# Patient Record
Sex: Female | Born: 1975 | Smoking: Current every day smoker
Health system: Southern US, Community
[De-identification: ages and names within clinical notes are randomized; demographics above are authoritative.]

## PROBLEM LIST (undated history)

## (undated) DIAGNOSIS — I959 Hypotension, unspecified: Secondary | ICD-10-CM

## (undated) DIAGNOSIS — M069 Rheumatoid arthritis, unspecified: Secondary | ICD-10-CM

## (undated) DIAGNOSIS — M329 Systemic lupus erythematosus, unspecified: Secondary | ICD-10-CM

## (undated) DIAGNOSIS — J45909 Unspecified asthma, uncomplicated: Secondary | ICD-10-CM

## (undated) DIAGNOSIS — IMO0002 Reserved for concepts with insufficient information to code with codable children: Secondary | ICD-10-CM

## (undated) DIAGNOSIS — I951 Orthostatic hypotension: Secondary | ICD-10-CM

## (undated) DIAGNOSIS — G90A Postural orthostatic tachycardia syndrome (POTS): Secondary | ICD-10-CM

## (undated) DIAGNOSIS — I498 Other specified cardiac arrhythmias: Secondary | ICD-10-CM

## (undated) DIAGNOSIS — R000 Tachycardia, unspecified: Secondary | ICD-10-CM

## (undated) DIAGNOSIS — M1990 Unspecified osteoarthritis, unspecified site: Secondary | ICD-10-CM

## (undated) HISTORY — DX: Unspecified asthma, uncomplicated: J45.909

## (undated) HISTORY — DX: Hypotension, unspecified: I95.9

## (undated) HISTORY — DX: Rheumatoid arthritis, unspecified: M06.9

## (undated) HISTORY — DX: Systemic lupus erythematosus, unspecified: M32.9

---
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
## 2009-10-03 NOTE — ED Provider Notes (Signed)
Patient is a 34 y.o. female presenting with ankle pain. The history is provided by the patient.   Ankle Pain   This is a new problem. The current episode started yesterday. The problem occurs constantly. The problem has been gradually worsening. The pain is present in the right knee (injured her knee yesterday while moving.  She twisted and hit it on a box spring.  She was able to use it yesteraday but today the pain is increased and some tingling in her rt great and second toes.  Some bruising medially). Pertinent negatives include no back pain.        Past Medical History   Diagnosis Date   ??? Asthma           Past Surgical History   Procedure Date   ??? Hx cholecystectomy    ??? Hx tubal ligation            No family history on file.     History   Social History   ??? Marital Status: Single     Spouse Name: N/A     Number of Children: N/A   ??? Years of Education: N/A   Occupational History   ??? Not on file.   Social History Main Topics   ??? Smoking status: Current Everyday Smoker -- 1.0 packs/day for 16 years   ??? Smokeless tobacco: Not on file   ??? Alcohol Use: Yes      "once every 2 weeks"   ??? Drug Use: No   ??? Sexually Active: Yes -- Female partner(s)   Other Topics Concern   ??? Not on file   Social History Narrative   ??? No narrative on file           ALLERGIES: Sulfa (sulfonamide antibiotics), Pcn, Morphine, Percocet and Doxycycline      Review of Systems   Constitutional: Negative.    Musculoskeletal: Positive for gait problem. Negative for myalgias, back pain and joint swelling.   Skin: Negative.    Neurological:        Tingling in her rt great and second toes   Psychiatric/Behavioral: Negative.        Filed Vitals:    10/03/09 1012   BP: 129/68   Pulse: 80   Temp: 98 ??F (36.7 ??C)   Resp: 18   Height: 5\' 8"  (1.727 m)   Weight: 205 lb (92.987 kg)   SpO2: 99%              Physical Exam   Constitutional: She appears well-developed and well-nourished.   HENT:   Head: Normocephalic.    Eyes: Conjunctivae are normal. Pupils are equal, round, and reactive to light.   Neck: Normal range of motion.   Cardiovascular: Normal rate.    Pulmonary/Chest: Effort normal and breath sounds normal.   Musculoskeletal: She exhibits tenderness.        Right knee: She exhibits no bony tenderness and no MCL laxity. tenderness found. Medial joint line and patellar tendon tenderness noted.        MDM    Procedures

## 2009-10-03 NOTE — ED Notes (Signed)
Pt states "i hurt my knee yesterday while moving" states "i twisted my right knee while moving a box" states pain and swelling no obvious deformity noted

## 2011-06-21 NOTE — ED Notes (Signed)
Patient states that she has a sore throat that hurts to swallow since yesterday, she has been running a fever and epigastric pain.  Patient denies N/V.

## 2011-06-21 NOTE — ED Notes (Signed)
Pt states she quit smoking two weeks ago.

## 2011-06-21 NOTE — ED Notes (Signed)
Pt given discharge instructions. Verbalized understanding. Opportunity given for questions. Accompanied by sel

## 2011-06-21 NOTE — ED Provider Notes (Signed)
HPI Comments: Patient presents with complaints of cough, sore throat and fever. Patient also reports epigastic abdominal pain with nausea. Patient reports history of asthma.     Patient is a 36 y.o. female presenting with sore throat, fever, and abdominal pain. The history is provided by the patient.   Sore Throat   This is a new problem. The current episode started yesterday. The problem has not changed since onset.There has been no fever. Associated symptoms include congestion and cough. Pertinent negatives include no diarrhea, no vomiting, no drooling, no ear discharge, no ear pain, no headaches, no plugged ear sensation, no shortness of breath, no stridor, no swollen glands, no trouble swallowing and no stiff neck. She has had no exposure to strep or mono. She has tried nothing for the symptoms.   Fever   This is a new problem. The current episode started yesterday. The problem occurs constantly. The problem has not changed since onset.Patient reports a subjective fever - was not measured.Associated symptoms include congestion, sore throat and cough. Pertinent negatives include no chest pain, no fussiness, no sleepiness, no diarrhea, no vomiting, no headaches, no tugging at ear, no muscle aches, no shortness of breath, no mental status change, no neck pain, no rash and no urinary symptoms. She has tried cough syrup for the symptoms.   Abdominal Pain   This is a new problem. The current episode started yesterday. The problem occurs constantly. The problem has not changed since onset.The pain is located in the epigastric region. The pain is at a severity of 5/10. The pain is mild. Associated symptoms include a fever and nausea. Pertinent negatives include no belching, no diarrhea, no flatus, no hematochezia, no melena, no vomiting, no constipation, no dysuria, no frequency, no hematuria, no headaches, no arthralgias, no myalgias, no trauma, no chest pain, no testicular pain and no back pain. Nothing worsens the  pain. The pain is relieved by nothing. Past workup includes no CT scan, no ultrasound, no surgery, no esophagogastroduodenoscopy, no UGI, no colonoscopy and no barium enema. The patient's surgical history non-contributory.       Past Medical History   Diagnosis Date   ??? Asthma         Past Surgical History   Procedure Date   ??? Hx cholecystectomy    ??? Hx tubal ligation          No family history on file.     History     Social History   ??? Marital Status: SINGLE     Spouse Name: N/A     Number of Children: N/A   ??? Years of Education: N/A     Occupational History   ??? Not on file.     Social History Main Topics   ??? Smoking status: Current Everyday Smoker -- 1.0 packs/day for 16 years   ??? Smokeless tobacco: Not on file   ??? Alcohol Use: Yes      "once every 2 weeks"   ??? Drug Use: No   ??? Sexually Active: Yes -- Female partner(s)     Other Topics Concern   ??? Not on file     Social History Narrative   ??? No narrative on file                  ALLERGIES: Doxycycline; Morphine; Pcn; Percocet; and Sulfa(sulfonamide antibiotics)      Review of Systems   Constitutional: Positive for fever.   HENT: Positive for congestion and sore throat. Negative  for ear pain, drooling, trouble swallowing, neck pain and ear discharge.    Eyes: Negative.    Respiratory: Positive for cough. Negative for shortness of breath and stridor.    Cardiovascular: Negative.  Negative for chest pain.   Gastrointestinal: Positive for nausea and abdominal pain. Negative for vomiting, diarrhea, constipation, melena, hematochezia and flatus.   Genitourinary: Negative.  Negative for dysuria, frequency, hematuria and testicular pain.   Musculoskeletal: Negative.  Negative for myalgias, back pain and arthralgias.   Skin: Negative.  Negative for rash.   Neurological: Negative.  Negative for headaches.   Hematological: Negative.    Psychiatric/Behavioral: Negative.    All other systems reviewed and are negative.        Filed Vitals:    06/21/11 2152   BP: 120/73   Pulse:  96   Temp: 99.6 ??F (37.6 ??C)   Resp: 16   Height: 5\' 6"  (1.676 m)   Weight: 90.719 kg (200 lb)   SpO2: 97%            Physical Exam   Nursing note and vitals reviewed.  Constitutional: She is oriented to person, place, and time. She appears well-developed and well-nourished.   HENT:   Head: Normocephalic and atraumatic.   Right Ear: Hearing, tympanic membrane, external ear and ear canal normal.   Left Ear: Hearing, tympanic membrane, external ear and ear canal normal.   Nose: No rhinorrhea.   Mouth/Throat: Posterior oropharyngeal erythema present. No oropharyngeal exudate, posterior oropharyngeal edema or tonsillar abscesses.   Eyes: Conjunctivae and EOM are normal. Pupils are equal, round, and reactive to light. Right eye exhibits no discharge. Left eye exhibits no discharge. No scleral icterus.   Neck: Normal range of motion. No tracheal deviation present. No thyromegaly present.   Cardiovascular: Normal rate, regular rhythm and normal heart sounds.    No murmur heard.  Pulmonary/Chest: Effort normal and breath sounds normal. No respiratory distress. She has no wheezes. She has no rales. She exhibits no tenderness.   Abdominal: Soft. Normal appearance and bowel sounds are normal. She exhibits no distension. There is no tenderness. There is no rigidity, no rebound, no guarding, no tenderness at McBurney's point and negative Murphy's sign.        Non-surgical abdomen.    Musculoskeletal: Normal range of motion. She exhibits no edema and no tenderness.   Lymphadenopathy:     She has no cervical adenopathy.   Neurological: She is alert and oriented to person, place, and time. No cranial nerve deficit. Coordination normal.   Skin: Skin is warm. No erythema.   Psychiatric: She has a normal mood and affect. Her behavior is normal. Judgment and thought content normal.        MDM    Procedures

## 2011-06-21 NOTE — ED Notes (Signed)
Pt ambulated to xray.

## 2011-06-22 LAB — URINALYSIS W/ REFLEX CULTURE
Bilirubin: NEGATIVE
Blood: NEGATIVE
Glucose: NEGATIVE MG/DL
Ketone: NEGATIVE MG/DL
Nitrites: NEGATIVE
Protein: NEGATIVE MG/DL
Specific gravity: 1.01 (ref 1.003–1.030)
Urobilinogen: 0.2 EU/DL (ref 0.2–1.0)
pH (UA): 7.5 (ref 5.0–8.0)

## 2011-06-22 MED ORDER — POLYETHYLENE GLYCOL 3350 100 % ORAL POWDER
17 gram/dose | Freq: Every day | ORAL | Status: DC
Start: 2011-06-22 — End: 2015-05-12

## 2011-06-22 MED ORDER — CIPROFLOXACIN 500 MG TAB
500 mg | ORAL_TABLET | Freq: Two times a day (BID) | ORAL | Status: AC
Start: 2011-06-22 — End: 2011-06-26

## 2011-06-23 LAB — CULTURE, URINE
Colonies Counted: <1000
Colony Count: <1000
Culture result:: NO GROWTH
Culture: NO GROWTH

## 2011-06-27 NOTE — ED Provider Notes (Signed)
I have personally seen and evaluated patient. I find the patient's history and physical exam are consistent with the PA's NP documentation. I agree with the care provided, treatments rendered, disposition and follow up plan.

## 2011-09-15 ENCOUNTER — Encounter

## 2015-04-20 ENCOUNTER — Ambulatory Visit: Admit: 2015-04-20 | Attending: Specialist | Primary: Orthopaedic Surgery

## 2015-04-20 DIAGNOSIS — R55 Syncope and collapse: Secondary | ICD-10-CM

## 2015-04-20 NOTE — Patient Instructions (Signed)
Stress Echo and 24 hour holter     Follow up with Dr.Giusti a week after tests

## 2015-04-20 NOTE — Addendum Note (Signed)
Addended by: Lavell IslamPARKS, Marien Manship P on: 04/20/2015 03:07 PM      Modules accepted: Orders

## 2015-04-20 NOTE — Progress Notes (Signed)
Chief Complaint   Patient presents with   ??? New Patient     cardiac clearance for wrist surgery.  Occasional chest discomfort.  States due to asthma and lupus.  Continuous shortness of breath.

## 2015-04-20 NOTE — Progress Notes (Signed)
HISTORY OF PRESENT ILLNESS  Amber Stewart is a 39 y.o. female. 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    Cardiovascular: Negative.    Neurological: Positive for loss of consciousness.       Physical Exam  Physical Exam   Blood pressure 118/76, pulse 81, resp. rate 16, height 5' 5.5" (1.664 m), weight 199 lb 6.4 oz (90.4 kg), SpO2 97 %.  Constitutional: She is oriented to person, place, and time. She appears well-developed and well-nourished. No distress. HENT: Head: Normocephalic. Eyes: No scleral icterus. Neck: Normal range of motion. Neck supple. No JVD present. No tracheal deviation present.   Cardiovascular: Normal rate, regular rhythm, normal heart sounds and intact distal pulses.  Exam reveals no gallop and no friction rub.  No murmur heard.  Pulmonary/Chest: Effort normal and breath sounds normal. No stridor. No respiratory distress, wheezes or  rales.   Abdominal: She exhibits no distension.   Musculoskeletal: She exhibits no edema.   Neurological: She is alert and oriented to person, place, and time. Coordination normal.   Skin: Skin is warm. No rash noted. Not diaphoretic. No erythema.   Psychiatric:  Normal mood and affect.  Behavior is normal.   No current outpatient prescriptions on file prior to visit.     No current facility-administered medications on file prior to visit.        ASSESSMENT and PLAN  Syncope: unclear etiology at this time. Her ECG shows NSR , NT and prwp anterior leads. Exaggerated vagal response  Possibility [facility]   In the meanwhile proceed with stress echo to r/o ischemia , evaluate ef and evaluate hr response to exercise  Her BP is well controlled  Tobacco cessation discussed  Obtain 24 hours holter as well  See her back after tests

## 2015-04-27 ENCOUNTER — Encounter: Primary: Orthopaedic Surgery

## 2015-04-27 ENCOUNTER — Institutional Professional Consult (permissible substitution): Admit: 2015-04-27 | Primary: Orthopaedic Surgery

## 2015-04-27 DIAGNOSIS — R55 Syncope and collapse: Secondary | ICD-10-CM

## 2015-04-27 NOTE — Progress Notes (Signed)
24hour Holter monitor # [id number] applied per Dr. Arnoldo LenisGiusti dx. Syncope patient to return monitor 04/28/15 patient made aware

## 2015-05-06 ENCOUNTER — Encounter: Admit: 2015-05-06 | Discharge: 2015-05-06 | Primary: Orthopaedic Surgery

## 2015-05-06 ENCOUNTER — Institutional Professional Consult (permissible substitution): Admit: 2015-05-06 | Discharge: 2015-05-06 | Primary: Orthopaedic Surgery

## 2015-05-06 DIAGNOSIS — R55 Syncope and collapse: Secondary | ICD-10-CM

## 2015-05-06 NOTE — Progress Notes (Signed)
Stress echo completed.

## 2015-05-12 ENCOUNTER — Ambulatory Visit: Admit: 2015-05-12 | Attending: Specialist | Primary: Orthopaedic Surgery

## 2015-05-12 DIAGNOSIS — R55 Syncope and collapse: Secondary | ICD-10-CM

## 2015-05-12 MED ORDER — FLUDROCORTISONE 0.1 MG TAB
0.1 mg | ORAL_TABLET | Freq: Every day | ORAL | 3 refills | Status: DC
Start: 2015-05-12 — End: 2015-09-04

## 2015-05-12 NOTE — Telephone Encounter (Signed)
Verbal order per Dr.Giusti

## 2015-05-12 NOTE — Patient Instructions (Signed)
Start Florinef 0.1 mg daily    [facility] to [facility] [phone])  [address]

## 2015-05-12 NOTE — Progress Notes (Signed)
HISTORY OF PRESENT ILLNESS  Amber Stewart is a 39 y.o. 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    Cardiovascular: Negative.    Neurological: Negative.        Physical Exam  Physical Exam   Blood pressure 90/60, pulse 85, resp. rate 16, height 5' 5.5" (1.664 m), weight 200 lb 3.2 oz (90.8 kg), SpO2 98 %.  Constitutional: She is oriented to person, place, and time. She appears well-developed and well-nourished. No distress. HENT: Head: Normocephalic. Eyes: No scleral icterus. Neck: Normal range of motion. Neck supple. No JVD present. No tracheal deviation present.   Cardiovascular: Normal rate, regular rhythm, normal heart sounds and intact distal pulses.  Exam reveals no gallop and no friction rub.  No murmur heard.  Pulmonary/Chest: Effort normal and breath sounds normal. No stridor. No respiratory distress, wheezes or  rales.   Abdominal: She exhibits no distension.   Musculoskeletal: She exhibits no edema.   Neurological: She is alert and oriented to person, place, and time. Coordination normal.   Skin: Skin is warm. No rash noted. Not diaphoretic. No erythema.   Psychiatric:  Normal mood and affect.  Behavior is normal.   Current Outpatient Prescriptions on File Prior to Visit   Medication Sig Dispense Refill   ??? hydroxychloroquine (PLAQUENIL) 200 mg tablet Take 200 mg by mouth daily.     ??? traMADol (ULTRAM-ER) 100 mg Tb24 Take 100 mg by mouth three (3) times daily.      ??? gabapentin (NEURONTIN) 300 mg capsule Take 300 mg by mouth three (3) times daily.     ??? diazePAM (VALIUM) 2 mg tablet Take 2 mg by mouth nightly.     ??? ibuprofen (MOTRIN) 600 mg tablet Take  by mouth every six (6) hours as needed for Pain.     ??? HYDROMORPHONE HCL (DILAUDID PO) Take 5 mg by mouth as needed.     ??? naproxen sodium (ALEVE) 220 mg tablet Take 220 mg by mouth two (2) times daily (with meals).         No current facility-administered medications on file prior to visit.        ASSESSMENT and PLAN  Syncope: not recurrent. Her stress echo and holter are essentially normal. I suspect she does have some degree of autonomic dysfunction  HUT on hold for now and she is completely agreeable to hold off hut  Still a bit symptomatic with lightheadedness , discussed options  Start florinef 0.1 mg daily. Side effects discussed patient agreable    Exaggerated vagal response also a possibility  Tobacco cessation discussed  Preop: at this time the patient is at an acceptable cardiac risk to proceed with wrist surgery as planned. Would recommend aggressive hydration preop such as 1 liter of NS prior to surgery and keeping her well hydrated  She has asked me to be followed at [facility] for insurance  issues. I will refer her to Dr. Ladona HornsLevitt for follow up of florinef rx and autonomic dysfunction

## 2015-05-28 NOTE — Telephone Encounter (Signed)
Patient called regarding cardiac clearance for upcoming wrist surgery. She would like [facility] [phone]. If you have any questions for patient she can be reached at [phone]

## 2015-05-28 NOTE — Telephone Encounter (Signed)
Left message on patient's voice mail (states name) regarding cardiac clearance.  Copy of office visit sent to Dr. Lockie Molahillon.  Encouraged her to  call back for further needs.

## 2015-06-04 NOTE — Telephone Encounter (Signed)
Called and left message with Charlene at Dr. Ladona Mowhillon's office to verify that they received office note regarding pre-op concerns.

## 2015-06-04 NOTE — Telephone Encounter (Signed)
Amber Stewart called from Dr. Ladona Mowhillon's office and stated their office received your fax but they did not receive the cardiac clearance note. Amber Stewart can be reached at [phone]

## 2015-06-04 NOTE — Telephone Encounter (Signed)
Faxed and confirmation received.

## 2015-06-04 NOTE — Telephone Encounter (Signed)
Terry with [facility] called to ask if you could fax the cardiac clearance again but to fax # [phone], phone if needed is [phone].     Thank you, Olegario MessierKathy

## 2015-06-04 NOTE — Telephone Encounter (Signed)
Copy of  05-12-15 office visit faxed to [facility] at Dr. Ladona Mowhillon's office.  Confirmation received.  Called and left message with Aurther Lofterry regarding receiving and if information was sufficient.  Encouraged her to call with further needs.

## 2015-06-05 NOTE — Telephone Encounter (Signed)
Spoke to [place] at Dr. Ladona Mowhillon's office.  She received office visit note with  pre-op information.  Dr. Ladona Mowhillon's nurse to review and call us for further needs.

## 2015-09-07 MED ORDER — FLUDROCORTISONE 0.1 MG TAB
0.1 mg | ORAL_TABLET | ORAL | 2 refills | Status: DC
Start: 2015-09-07 — End: 2016-06-17

## 2015-09-07 NOTE — Telephone Encounter (Signed)
Verbal order per Dr.Giusti

## 2016-04-10 ENCOUNTER — Emergency Department (HOSPITAL_COMMUNITY)
Admission: EM | Admit: 2016-04-10 | Discharge: 2016-04-10 | Disposition: A | Discharge status: Home / Self Care | Payer: 59 | Attending: Emergency Medicine | Admitting: Emergency Medicine

## 2016-04-10 ENCOUNTER — Encounter (HOSPITAL_COMMUNITY): Payer: Self-pay | Admitting: *Deleted

## 2016-04-10 ENCOUNTER — Emergency Department (HOSPITAL_COMMUNITY): Payer: 59

## 2016-04-10 DIAGNOSIS — R059 Cough, unspecified: Secondary | ICD-10-CM

## 2016-04-10 DIAGNOSIS — J40 Bronchitis, not specified as acute or chronic: Secondary | ICD-10-CM | POA: Insufficient documentation

## 2016-04-10 DIAGNOSIS — R0602 Shortness of breath: Secondary | ICD-10-CM | POA: Diagnosis present

## 2016-04-10 DIAGNOSIS — F17200 Nicotine dependence, unspecified, uncomplicated: Secondary | ICD-10-CM | POA: Diagnosis not present

## 2016-04-10 DIAGNOSIS — R05 Cough: Secondary | ICD-10-CM

## 2016-04-10 HISTORY — DX: Orthostatic hypotension: I95.1

## 2016-04-10 HISTORY — DX: Reserved for concepts with insufficient information to code with codable children: IMO0002

## 2016-04-10 HISTORY — DX: Postural orthostatic tachycardia syndrome (POTS): G90.A

## 2016-04-10 HISTORY — DX: Tachycardia, unspecified: R00.0

## 2016-04-10 HISTORY — DX: Unspecified asthma, uncomplicated: J45.909

## 2016-04-10 HISTORY — DX: Systemic lupus erythematosus, unspecified: M32.9

## 2016-04-10 HISTORY — DX: Unspecified osteoarthritis, unspecified site: M19.90

## 2016-04-10 HISTORY — DX: Other specified cardiac arrhythmias: I49.8

## 2016-04-10 LAB — URINALYSIS, ROUTINE W REFLEX MICROSCOPIC
BILIRUBIN URINE: NEGATIVE
GLUCOSE, UA: NEGATIVE mg/dL
KETONES UR: NEGATIVE mg/dL
Nitrite: NEGATIVE
PH: 7 (ref 5.0–8.0)
Protein, ur: NEGATIVE mg/dL
SPECIFIC GRAVITY, URINE: 1.01 (ref 1.005–1.030)

## 2016-04-10 LAB — URINE MICROSCOPIC-ADD ON

## 2016-04-10 LAB — PREGNANCY, URINE: PREG TEST UR: NEGATIVE

## 2016-04-10 MED ORDER — PREDNISONE 10 MG PO TABS: 40.0000 mg | ORAL_TABLET | Freq: Every day | ORAL | 0 refills | Status: AC

## 2016-04-10 MED ORDER — ALBUTEROL SULFATE HFA 108 (90 BASE) MCG/ACT IN AERS
2.0000 | INHALATION_SPRAY | Freq: Once | RESPIRATORY_TRACT | Status: AC
  Administered 2016-04-10: 2 via RESPIRATORY_TRACT
  Filled 2016-04-10: qty 6.7

## 2016-04-10 MED ORDER — PREDNISONE 20 MG PO TABS
60.0000 mg | ORAL_TABLET | Freq: Once | ORAL | Status: AC
Start: 2016-04-10 — End: 2016-04-10
  Administered 2016-04-10: 60 mg via ORAL
  Filled 2016-04-10: qty 3

## 2016-04-10 NOTE — ED Provider Notes (Signed)
[facility] DEPT Provider Note   CSN: [id number] Arrival date & time: 04/10/16  1443     History   Chief Complaint Chief Complaint  Patient presents with  . Cough  . Shortness of Breath    HPI Sandra Mathis is a 40 y.o. female.  HPI   Cough 3.5 weeks Started out as normal cold, sinus congestion, using tussin DM max, tylenol severe cold, head congestion went away but cough stayed. Went from mild irritation, was nonproductive, but in the last week has been more constant. Now when get up and walk around feels more wheezing, lay down coughing, if eat, coughing, coughing when trying to take meds.  Inhaler helps wheezing to stop for maybe 20 minutes then it comes back again. Coughing so much having pain through hips and lower back/, feels like pulled all the muscles. Had episode of lupus in the past when it attacked lungs and was admitted for 10 days.   Past Medical History:  Diagnosis Date  . Arthritis   . Asthma   . Lupus   . POTS (postural orthostatic tachycardia syndrome)     There are no active problems to display for this patient.   History reviewed. No pertinent surgical history.  OB History    No data available       Home Medications    Prior to Admission medications   Medication Sig Start Date End Date Taking? Authorizing Provider  albuterol (PROVENTIL HFA;VENTOLIN HFA) 108 (90 Base) MCG/ACT inhaler Inhale 2 puffs into the lungs every 6 (six) hours as needed for wheezing or shortness of breath.   Yes Historical Provider, MD  diazepam (VALIUM) 2 MG tablet Take 2 mg by mouth at bedtime.   Yes Historical Provider, MD  diclofenac sodium (VOLTAREN) 1 % GEL Apply 2 g topically 4 (four) times daily.   Yes Historical Provider, MD  diphenhydrAMINE (BENADRYL) 50 MG capsule Take 50 mg by mouth daily.   Yes Historical Provider, MD  gabapentin (NEURONTIN) 300 MG capsule Take 300 mg by mouth every 6 (six) hours as needed (arthritis).   Yes Historical Provider, MD    HYDROmorphone (DILAUDID) 2 MG tablet Take 2 mg by mouth every 6 (six) hours as needed for severe pain.   Yes Historical Provider, MD  ibuprofen (ADVIL,MOTRIN) 800 MG tablet Take 800 mg by mouth every 6 (six) hours as needed for mild pain.   Yes Historical Provider, MD  Phenylephrine-DM-GG-APAP (TYLENOL COLD/FLU SEVERE PO) Take 10 mLs by mouth daily as needed (cough / congestion).   Yes Historical Provider, MD  traMADol (ULTRAM) 50 MG tablet Take 50 mg by mouth every 6 (six) hours as needed for moderate pain.   Yes Historical Provider, MD  predniSONE (DELTASONE) 10 MG tablet Take 4 tablets (40 mg total) by mouth daily. 04/10/16 04/14/16  Gareth Morgan, MD    Family History History reviewed. No pertinent family history.  Social History Social History  Substance Use Topics  . Smoking status: Current Every Day Smoker  . Smokeless tobacco: Not on file  . Alcohol use Yes     Comment: occ     Allergies   Pineapple flavor; Shellfish allergy; Adhesive [tape]; Codeine; Doxycycline; Iodine; Lithium; Lyrica [pregabalin]; Morphine and related; Penicillins; Percocet [oxycodone-acetaminophen]; Singulair [montelukast sodium]; and Sulfa antibiotics   Review of Systems Review of Systems  Constitutional: Negative for appetite change and fever.  HENT: Negative for congestion and sore throat.   Eyes: Negative for visual disturbance.  Respiratory: Positive for cough, chest  tightness, shortness of breath and wheezing.   Cardiovascular: Positive for chest pain (tightness that get with asthma).  Gastrointestinal: Positive for abdominal pain and vomiting. Negative for diarrhea and nausea.  Genitourinary: Negative for difficulty urinating and dysuria.  Musculoskeletal: Positive for back pain. Negative for neck pain.  Skin: Negative for rash.  Neurological: Negative for syncope and headaches. Light-headedness: every so often.     Physical Exam Updated Vital Signs BP 121/87   Pulse 73   Temp 98 F  (36.7 C) (Oral)   Resp 25   LMP 04/08/2016   SpO2 98%   Physical Exam  Constitutional: She is oriented to person, place, and time. She appears well-developed and well-nourished. No distress.  HENT:  Head: Normocephalic and atraumatic.  Eyes: Conjunctivae and EOM are normal.  Neck: Normal range of motion.  Cardiovascular: Normal rate, regular rhythm, normal heart sounds and intact distal pulses.  Exam reveals no gallop and no friction rub.   No murmur heard. Pulmonary/Chest: Effort normal and breath sounds normal. No respiratory distress. She has no wheezes. She has no rales.  Bronchitic cough, diminished breath sounds  Abdominal: Soft. She exhibits no distension. There is no tenderness. There is no guarding.  Musculoskeletal: She exhibits no edema or tenderness.  Neurological: She is alert and oriented to person, place, and time.  Skin: Skin is warm and dry. No rash noted. She is not diaphoretic. No erythema.  Nursing note and vitals reviewed.    ED Treatments / Results  Labs (all labs ordered are listed, but only abnormal results are displayed) Labs Reviewed  URINALYSIS, [place] (NOT AT [facility]) - Abnormal; Notable for the following:       Result Value   Hgb urine dipstick LARGE (*)    Leukocytes, UA TRACE (*)    All other components within normal limits  URINE MICROSCOPIC-ADD ON - Abnormal; Notable for the following:    Squamous Epithelial / LPF 0-5 (*)    Bacteria, UA RARE (*)    All other components within normal limits  PREGNANCY, URINE    EKG  EKG Interpretation  Date/Time:  Sunday April 10 2016 14:47:52 EST Ventricular Rate:  104 PR Interval:  146 QRS Duration: 82 QT Interval:  332 QTC Calculation: 436 R Axis:   87 Text Interpretation:  Sinus tachycardia Cannot rule out Anterior infarct , age undetermined Abnormal ECG No previous ECGs available Confirmed by [facility] MD, [place] ([id number]) on 04/10/2016 3:00:52 PM       Radiology Dg Chest  2 View  Result Date: 04/10/2016 CLINICAL DATA:  Dry cough and chest tightness for 3.5 weeks, history lupus, asthma, POTS, hypotension EXAM: CHEST  2 VIEW COMPARISON:  None FINDINGS: Normal heart size, mediastinal contours, and pulmonary vascularity. Minimal peribronchial thickening. No pulmonary infiltrate, pleural effusion, or pneumothorax. Bones unremarkable. IMPRESSION: Minimal peribronchial thickening which could reflect bronchitis or asthma. No acute infiltrate. Electronically Signed   By: Lavonia Dana M.D.   On: 04/10/2016 15:59    Procedures Procedures (including critical care time)  Medications Ordered in ED Medications  predniSONE (DELTASONE) tablet 60 mg (60 mg Oral Given 04/10/16 1828)  albuterol (PROVENTIL HFA;VENTOLIN HFA) 108 (90 Base) MCG/ACT inhaler 2 puff (2 puffs Inhalation Given 04/10/16 1828)     Initial Impression / Assessment and Plan / ED Course  I have reviewed the triage vital signs and the nursing notes.  Pertinent labs & imaging results that were available during my care of the patient were reviewed by  me and considered in my medical decision making (see chart for details).  Clinical Course     40 year old female with a history of asthma, lupus presents with concern for cough for 3-1/2 weeks, wheezing. Chest x-ray shows no sign of pneumonia, no congestive heart failure. EKG shows no sign of pericarditis or other acute changes. Patient with some diminished breath sounds in bronchitic cough and exam, and suspect continuing cough likely secondary to asthma. Given 60 mg of prednisone, albuterol inhaler, and will discharge with 4 days of 40 mg.  No sign of pneumonia.  The patient was awaiting discharge, reports that she is concerned that the pain she is describing likely related to muscular pain from coughing and her back was related to UTI. Urinalysis performed showing no sign of urinary tract infection. Have low suspicion for nephrolithiasis given patient comfortable on  exam and history more consistent with muscular pain from coughing. Pregnancy test negative. Patient discharged in stable condition with understanding of reasons to return.   Final Clinical Impressions(s) / ED Diagnoses   Final diagnoses:  Cough  Bronchitis    New Prescriptions New Prescriptions   PREDNISONE (DELTASONE) 10 MG TABLET    Take 4 tablets (40 mg total) by mouth daily.     Gareth Morgan, MD 04/11/16 1222

## 2016-04-10 NOTE — ED Triage Notes (Signed)
Pt reports ongoing cough for over a week. Hx of asthma and lupus, reports wheezing and sob. Denies fever. Reports having coughing spells while eating that results in n/v. No resp distress noted at triage.

## 2016-04-10 NOTE — ED Notes (Signed)
Patient transported to X-ray 

## 2016-06-17 ENCOUNTER — Ambulatory Visit: Admit: 2016-06-17 | Attending: Specialist | Primary: Orthopaedic Surgery

## 2016-06-17 DIAGNOSIS — R55 Syncope and collapse: Secondary | ICD-10-CM

## 2016-06-17 NOTE — Patient Instructions (Signed)
Tilt table     Follow up with Dr.Giusti in 4 months    Your Tilt Table procedure has been scheduled for 06/29/16 at 10:45 am, at [facility].    Please report to Admitting Department by 8:45 am, or 2 hours prior to your scheduled procedure. Please bring a list of your current medications and medication bottles, if able, to the hospital on this day.  You will be unable to drive after your procedure so please make sure to bring someone with you to your procedure.    You will need to have nothing to eat or drink after midnight, the night prior to your procedure. You may have small sips of water, if needed, to take with your medication.    You will need labs drawn prior to your procedure. Please go to [facility] to have this done as soon as you are able.     You should not stop your medication.    After your procedure, you will need to follow up with Dr. Loma NewtonNgo. Your follow-up appointment has been scheduled for 07/07/16 at 8:20 am.

## 2016-06-17 NOTE — Progress Notes (Addendum)
HISTORY OF PRESENT ILLNESS  Amber Stewart is a 41 y.o. female.Patient with h/o SLE, Rheumatoid arthritis here for  evaluation prior wrist high intensity evaluation  Episode of syncope 15 years ago while in church, HUT at that time reported by patient as normal  Again brady and syncope postop on 5/16  Stress echo on 11/16 no ischemia or mi EF 60% no rhythm issues  holter on 11/16 NSR 47-123 no pauses no pacs no pvcs  PMH as above  PSH: cholecystectomy, tubal ligation  SH: 4 cigs A day and occasional etoh previously stocker at [employer]  FH: significant for CAD at early age  HPI  Unable to tolerate florinef  No recurrent dizziness or syncope  Review of Systems   Constitutional: Negative.    Respiratory: Negative.    Cardiovascular: Negative.    Neurological: Negative.        Physical Exam  Physical Exam   Blood pressure 130/80, pulse 79, resp. rate 16, height 5' 5.5" (1.664 m), weight 97.5 kg (215 lb), SpO2 99 %.  Constitutional: She is oriented to person, place, and time. She appears well-developed and well-nourished. No distress. HENT: Head: Normocephalic. Eyes: No scleral icterus. Neck: Normal range of motion. Neck supple. No JVD present. No tracheal deviation present.   Cardiovascular: Normal rate, regular rhythm, normal heart sounds and intact distal pulses.  Exam reveals no gallop and no friction rub.  No murmur heard.  Pulmonary/Chest: Effort normal and breath sounds normal. No stridor. No respiratory distress, wheezes or  rales.   Abdominal: She exhibits no distension.   Musculoskeletal: She exhibits no edema.   Neurological: She is alert and oriented to person, place, and time. Coordination normal.   Skin: Skin is warm. No rash noted. Not diaphoretic. No erythema.   Psychiatric:  Normal mood and affect.  Behavior is normal.   Current Outpatient Prescriptions on File Prior to Visit   Medication Sig Dispense Refill   ??? traMADol (ULTRAM-ER) 100 mg Tb24 Take 100 mg by mouth as needed.      ??? diazePAM (VALIUM) 2 mg tablet Take 2 mg by mouth as needed.     ??? ibuprofen (MOTRIN) 600 mg tablet Take  by mouth every six (6) hours as needed for Pain.     ??? naproxen sodium (ALEVE) 220 mg tablet Take 220 mg by mouth two (2) times daily (with meals).       ??? gabapentin (NEURONTIN) 300 mg capsule Take 300 mg by mouth as needed.       No current facility-administered medications on file prior to visit.        ASSESSMENT and PLAN  Syncope: not recurrent. Her stress echo and holter in 2016 were essentially normal. I suspect she does have some degree of autonomic dysfunction  She was mildly orthostatic today and symptomatic with dizziness  Proceed with HUT  Midodrine on hold for now  Liberalize salt  Exaggerated vagal response also a possibility  Tobacco cessation discussed  See her back after hut

## 2016-06-17 NOTE — Addendum Note (Signed)
Addended by: Renae FickleAGEE, Lasharn Bufkin G on: 06/17/2016 04:24 PM      Modules accepted: Orders

## 2016-06-17 NOTE — Addendum Note (Signed)
Addended by: Lavell IslamPARKS, Giselle Brutus P on: 06/17/2016 04:15 PM      Modules accepted: Orders

## 2016-06-20 NOTE — Telephone Encounter (Signed)
Verified patient with two types of identifiers. Patient scheduled for appointment to see Dr. Loma NewtonNgo tomorrow, 06/21/16 at 1:20 pm. Patient verbalized understanding and will call with any other questions.

## 2016-06-20 NOTE — Telephone Encounter (Signed)
Attempted to reach patient by telephone to schedule appointment with Dr. Loma NewtonNgo prior to scheduled Tilt Table test. A message was left for return call.

## 2016-06-21 ENCOUNTER — Ambulatory Visit: Admit: 2016-06-21 | Attending: Clinical Cardiac Electrophysiology | Primary: Orthopaedic Surgery

## 2016-06-21 DIAGNOSIS — R55 Syncope and collapse: Secondary | ICD-10-CM

## 2016-06-21 NOTE — Progress Notes (Signed)
Cardiac Electrophysiology Office Consultation Note     Subjective:      Amber Stewart is a 41 y.o. patient who is seen for evaluation of recurrent syncope and near syncope for the past 15 years but she has had 5 events for the past 7 months  Usually these are precipitated by standing or doing something and stayed upright  She said florinef did not work and she has been told to consider tilt test before midodrine  She has no chest pain, palpitation  She had stress echo 2016 that was normal no ischemia infarct and LVEF normal  holter did not show pauses or av block in 2016.  There was mild sinus bradycardia    Patient Active Problem List   Diagnosis Code   ??? Lupus L93.0   ??? Syncope and collapse R55     Current Outpatient Prescriptions   Medication Sig Dispense Refill   ??? albuterol (PROVENTIL HFA, VENTOLIN HFA, PROAIR HFA) 90 mcg/actuation inhaler Take 2 Puffs by inhalation.     ??? diphenhydrAMINE (BENADRYL) 25 mg capsule Take 75 mg by mouth daily.     ??? traMADol (ULTRAM-ER) 100 mg Tb24 Take 100 mg by mouth as needed.     ??? gabapentin (NEURONTIN) 300 mg capsule Take 300 mg by mouth as needed.     ??? diazePAM (VALIUM) 2 mg tablet Take 2 mg by mouth as needed.     ??? ibuprofen (MOTRIN) 600 mg tablet Take  by mouth every six (6) hours as needed for Pain.     ??? naproxen sodium (ALEVE) 220 mg tablet Take 220 mg by mouth two (2) times daily (with meals).         Allergies   Allergen Reactions   ??? Latex Rash   ??? Adhesive Rash     Rash and blisters.    ??? Celebrex [Celecoxib] Other (comments)     Respiratory depression   ??? Doxycycline Nausea and Vomiting   ??? Iodine Rash     Respiratory difficulties   ??? Lithium Diarrhea   ??? Morphine Anaphylaxis   ??? Morphine Other (comments)     States cardiac arrhythmias   ??? Pcn [Penicillins] Rash and Other (comments)      fever   ??? Pcn [Penicillins] Other (comments)     Respiratory depression   ??? Percocet [Oxycodone-Acetaminophen] Rash      Heart racing     ??? Percocet [Oxycodone-Acetaminophen] Other (comments)     Respiratory depression   ??? Sulfa (Sulfonamide Antibiotics) Anaphylaxis   ??? Sulfa (Sulfonamide Antibiotics) Nausea and Vomiting     Past Medical History:   Diagnosis Date   ??? Asthma      Past Surgical History:   Procedure Laterality Date   ??? HX CHOLECYSTECTOMY     ??? HX TUBAL LIGATION       No family history on file.  Social History   Substance Use Topics   ??? Smoking status: Current Every Day Smoker     Packs/day: 0.10     Types: Cigarettes   ??? Smokeless tobacco: Never Used   ??? Alcohol use Yes      Comment: "once every 2 weeks"        Review of Systems:   Constitutional: Negative for fever, chills, weight loss, malaise/fatigue.   HEENT: Negative for nosebleeds, vision changes.   Respiratory: Negative for cough, hemoptysis  Cardiovascular: Negative for chest pain, palpitations, orthopnea, claudication, leg swelling and PND.   Gastrointestinal: Negative for nausea, vomiting,  diarrhea, blood in stool and melena.   Genitourinary: Negative for dysuria, and hematuria.   Musculoskeletal: Negative for myalgias, arthralgia.   Skin: Negative for rash.   Heme: Does not bleed or bruise easily.   Neurological: Negative for speech change and focal weakness     Objective:     Visit Vitals   ??? BP 120/70 (BP 1 Location: Left arm, BP Patient Position: Sitting)   ??? Pulse 82   ??? Ht 5\' 5"  (1.651 m)   ??? Wt 217 lb (98.4 kg)   ??? BMI 36.11 kg/m2      Physical Exam:   Constitutional: well-developed and well-nourished. No respiratory distress.   Head: Normocephalic and atraumatic.   Eyes: Pupils are equal, round  ENT: hearing normal  Neck: supple. No JVD present.   Cardiovascular: Normal rate, regular rhythm. Exam reveals no gallop and no friction rub. No murmur heard.  Pulmonary/Chest: Effort normal and breath sounds normal. No wheezes.   Abdominal: Soft, mild obese  Musculoskeletal: no edema.   Neurological: alert,oriented.   Skin: Skin is warm and dry   Psychiatric: normal mood and affect. Behavior is normal. Judgment and thought content normal.         Assessment/Plan:       ICD-10-CM ICD-9-CM    1. Syncope and collapse R55 780.2    2. Rheumatoid arthritis of wrist, unspecified laterality, unspecified rheumatoid factor presence (HCC) M06.9 714.0        She has been adding salt and drinking water  She has rheumatoid arthritis and surgery last May and she has had 5 near syncope and syncope since then  We discussed tilt table test and she agrees to proceed      Thank you for involving me in this patient's care and please call with further concerns or questions.      Juliet RudeMatthew M. Layci Stenglein, M.D.  Electrophysiology/Cardiology  [facility]  [address]                      [address]  [place], [place] [id number]                             [place] [id number]  [phone]                                        [phone]

## 2016-06-22 LAB — CBC WITH AUTOMATED DIFF
ABS. BASOPHILS: 0 10*3/uL (ref 0.0–0.2)
ABS. EOSINOPHILS: 0.3 10*3/uL (ref 0.0–0.4)
ABS. IMM. GRANS.: 0 10*3/uL (ref 0.0–0.1)
ABS. MONOCYTES: 0.5 10*3/uL (ref 0.1–0.9)
ABS. NEUTROPHILS: 6 10*3/uL (ref 1.4–7.0)
Abs Lymphocytes: 1.9 10*3/uL (ref 0.7–3.1)
BASOPHILS: 0 %
EOSINOPHILS: 4 %
HCT: 41.3 % (ref 34.0–46.6)
HGB: 13.3 g/dL (ref 11.1–15.9)
IMMATURE GRANULOCYTES: 0 %
Lymphocytes: 22 %
MCH: 28.1 pg (ref 26.6–33.0)
MCHC: 32.2 g/dL (ref 31.5–35.7)
MCV: 87 fL (ref 79–97)
MONOCYTES: 5 %
NEUTROPHILS: 69 %
PLATELET: 223 10*3/uL (ref 150–379)
RBC: 4.73 x10E6/uL (ref 3.77–5.28)
RDW: 14.1 % (ref 12.3–15.4)
WBC: 8.7 10*3/uL (ref 3.4–10.8)

## 2016-06-22 LAB — METABOLIC PANEL, BASIC
BUN/Creatinine ratio: 14 (ref 9–23)
BUN: 12 mg/dL (ref 6–20)
CO2: 23 mmol/L (ref 18–29)
Calcium: 9.1 mg/dL (ref 8.7–10.2)
Chloride: 96 mmol/L (ref 96–106)
Creatinine: 0.84 mg/dL (ref 0.57–1.00)
GFR est AA: 101 mL/min/{1.73_m2} (ref >59)
GFR est non-AA: 88 mL/min/{1.73_m2} (ref >59)
Glucose: 89 mg/dL (ref 65–99)
Potassium: 4.1 mmol/L (ref 3.5–5.2)
Sodium: 136 mmol/L (ref 134–144)

## 2016-06-22 NOTE — Addendum Note (Signed)
Addended by: Blenda Wisecup on: 06/22/2016 09:56 AM      Modules accepted: Orders

## 2016-06-22 NOTE — Progress Notes (Signed)
Cbc and BMP normal    06/29/2016  10:45 AM   CATH ROOM 2 [facility]            [facility]         [facility]  07/07/2016   8:20 AM    Thurston PoundsMatthew Kensington Rios, MD            [facility]         [facility] SCHED  10/21/2016  3:20 PM    Noah DelaineMassimo Giusti, MD         [facility]         [facility] SCHED

## 2016-06-29 ENCOUNTER — Inpatient Hospital Stay
Admit: 2016-06-29 | Payer: Worker's Compensation | Attending: Clinical Cardiac Electrophysiology | Primary: Orthopaedic Surgery

## 2016-06-29 DIAGNOSIS — R55 Syncope and collapse: Secondary | ICD-10-CM

## 2016-06-29 MED ORDER — NITROGLYCERIN 0.4 MG SUBLINGUAL TAB
0.4 mg | SUBLINGUAL | Status: DC | PRN
Start: 2016-06-29 — End: 2016-06-29
  Administered 2016-06-29: 15:00:00 via SUBLINGUAL

## 2016-06-29 MED ORDER — SODIUM CHLORIDE 0.9 % IJ SYRG
Freq: Three times a day (TID) | INTRAMUSCULAR | Status: DC
Start: 2016-06-29 — End: 2016-06-29

## 2016-06-29 MED ORDER — ATROPINE 0.1 MG/ML SYRINGE
0.1 mg/mL | INTRAMUSCULAR | Status: DC | PRN
Start: 2016-06-29 — End: 2016-06-29

## 2016-06-29 MED ORDER — SODIUM CHLORIDE 0.9 % IV
INTRAVENOUS | Status: DC
Start: 2016-06-29 — End: 2016-06-29
  Administered 2016-06-29: 14:00:00 via INTRAVENOUS

## 2016-06-29 MED ORDER — SALINE PERIPHERAL FLUSH PRN
INTRAMUSCULAR | Status: DC | PRN
Start: 2016-06-29 — End: 2016-06-29

## 2016-06-29 MED ORDER — SODIUM CHLORIDE 0.9 % IJ SYRG
INTRAMUSCULAR | Status: DC | PRN
Start: 2016-06-29 — End: 2016-06-29

## 2016-06-29 MED ORDER — MIDODRINE 2.5 MG TAB
2.5 mg | ORAL_TABLET | Freq: Three times a day (TID) | ORAL | 3 refills | Status: AC
Start: 2016-06-29 — End: 2016-07-29

## 2016-06-29 MED FILL — NORMAL SALINE FLUSH 0.9 % INJECTION SYRINGE: INTRAMUSCULAR | Qty: 10

## 2016-06-29 MED FILL — SODIUM CHLORIDE 0.9 % IV: INTRAVENOUS | Qty: 1000

## 2016-06-29 MED FILL — NITROGLYCERIN 0.4 MG SUBLINGUAL TAB: 0.4 mg | SUBLINGUAL | Qty: 1

## 2016-06-29 MED FILL — ATROPINE 0.1 MG/ML SYRINGE: 0.1 mg/mL | INTRAMUSCULAR | Qty: 10

## 2016-06-29 NOTE — Procedures (Signed)
DATE of PROCEDURE: 06/29/2016  Head-up tilt-table test with and without sublingual nitroglycerine     HISTORY:  This is a 41 y.o. woman with a history of dizziness and syncope and she did not tolerate florinef due to edema.  The patient was explained the risks and benefits of the head-up tilt-table test and she agreed to proceed.    PROCEDURE IN DETAIL:  After informed written consent was obtained, the patient was brought to the Electrophysiology Suite where she was laid on the tilt table.  The patient was strapped down for her safety and she had the baseline blood pressure of 123/81, heart rate of 66 beats per minute.  The patient was then tilted up to 70 degrees.  During the baseline tilt test blood pressure was 108/67 and sinus rate was 81 bpm and she was asymptomatic.  Nitroglycerin 0.4 mg sublingually was given and her heart rate increased fairly abruptly to 100 bpm and she was suddenly passing out. She was not able to tell us that she is about to pass out.   At first she was returned to the supine position and during this transition, her blood pressure not detected and her heart rate rapidly decelerates from 33 bpm.  When she was in the supine position, saline fluid was opened through the IV and her blood pressure improved.  The patient put her knees up and felt better. At the end, her blood pressure was 107/55 and heart rate was 86 beats per minute.  Specimen removed: NONE    ASSESSMENT AND PLAN:  The patient had a mixed response with sinus bradycardia and severe hypotension after a challenge of one sublingual nitroglycerin.  She will start midodrine. The patient will return for further follow up.

## 2016-06-29 NOTE — Other (Signed)
Cardiac Cath Lab Recovery Arrival Note:      Amber RogersCharmalee Olsen arrived to Cardiac Cath Lab, Recovery Area. Staff introduced to patient. Patient identifiers verified with NAME and DATE OF BIRTH. Procedure verified with patient. Consent forms reviewed and signed by patient or authorized representative and verified. Allergies verified.     Patient and family oriented to department. Patient and family informed of procedure and plan of care.     Questions answered with review. Patient prepped for procedure, per orders from physician, prior to arrival.    Patient on cardiac monitor, non-invasive blood pressure, SPO2 monitor. On RA. Patient is A&Ox 4. Patient reports no pain.     Patient in stretcher, in low position, with side rails up, call bell within reach, patient instructed to call if assistance as needed.    Patient prep in: CCL Recovery Area, [place] [address].   Patient family has pager #   Family in: .   Prep by: V. Barry Dieneswens RN

## 2016-06-29 NOTE — Progress Notes (Signed)
TRANSFER - IN REPORT:    Verbal report received from [facility] RN on [name]  being received from cath lab procedure area  for routine progression of care. Report consisted of patient???s Situation, Background, Assessment and Recommendations(SBAR). Information from the following report(s) Kardex and Procedure Summary was reviewed with the receiving clinician. Opportunity for questions and clarification was provided. Assessment completed upon patient???s arrival to Cardiac Cath Lab RECOVERY AREA and care assumed.       Cardiac Cath Lab Recovery Arrival Note:    Amber RogersCharmalee Stewart arrived to CCL recovery area.  Patient procedure= Tilt Table . Patient on cardiac monitor, non-invasive blood pressure, SPO2 monitor. On RA .  IV  Infiltrated so removed . Patient status doing well without problems. Patient is A&Ox 3. Patient reports no pain.    PROCEDURE SITE CHECK:    Procedure site:  none     No change in patient status. Continue to monitor patient and status.

## 2016-06-29 NOTE — Progress Notes (Signed)
Transfer to CCL RR from Procedure Area    Verbal report given to Vikki PortsValerie RN on [name] being transferred to Cardiac Cath Lab RR for routine post - op   Patient is post Tilt table test procedure.  Patient stable upon transfer to RR.      Report consisted of patient???s Situation, Background, Assessment and   Recommendations(SBAR).     Information from the following report(s) Procedure Summary, Intake/Output, MAR and Cardiac Rhythm NSR was reviewed with the receiving nurse.    Opportunity for questions and clarification was provided.      Patient medicated during procedure with orders obtained and verified by Dr. Loma NewtonNgo.    Refer to patient PROCEDURE REPORT for vital signs, assessment, status, and response during procedure.

## 2016-06-29 NOTE — Progress Notes (Signed)
Cardiac Cath Lab Procedure Area Arrival Note:    Amber RogersCharmalee Stewart arrived to Cardiac Cath Lab, Procedure Area. Patient identifiers verified with NAME and DATE OF BIRTH. Procedure verified with patient. Consent forms verified. Allergies verified. Patient informed of procedure and plan of care. Questions answered with review. Patient voiced understanding of procedure and plan of care.    Patient on cardiac monitor, non-invasive blood pressure, SPO2 monitor. On room air  IV of NS on pump at 10 ml/hr. Patient status doing well without problems. Patient is A&Ox 4. Patient reports no pain.     Patient medicated during procedure with orders obtained and verified by Dr. Loma NewtonNgo.    Refer to patients Cardiac Cath Lab PROCEDURE REPORT for vital signs, assessment, status, and response during procedure, printed at end of case. Printed report on chart or scanned into chart.

## 2016-06-29 NOTE — Procedures (Signed)
DATE of PROCEDURE: 06/29/2016  Head-up tilt-table test with and without sublingual nitroglycerine     HISTORY:  This is a 41 y.o. woman with a history of dizziness and syncope and she did not tolerate florinef due to edema.  The patient was explained the risks and benefits of the head-up tilt-table test and she agreed to proceed.    PROCEDURE IN DETAIL:  After informed written consent was obtained, the patient was brought to the Electrophysiology Suite where she was laid on the tilt table.  The patient was strapped down for her safety and she had the baseline blood pressure of 123/81, heart rate of 66 beats per minute.  The patient was then tilted up to 70 degrees.  During the baseline tilt test blood pressure was 108/67 and sinus rate was 81 bpm and she was asymptomatic.  Nitroglycerin 0.4 mg sublingually was given and her heart rate increased fairly abruptly to 100 bpm and she was suddenly passing out. She was not able to tell us that she is about to pass out.   At first she was returned to the supine position and during this transition, her blood pressure not detected and her heart rate rapidly decelerates from 33 bpm.  When she was in the supine position, saline fluid was opened through the IV and her blood pressure improved.  The patient put her knees up and felt better. At the end, her blood pressure was 107/55 and heart rate was 86 beats per minute.  Specimen removed: NONE    ASSESSMENT AND PLAN:  The patient had a mixed response with sinus bradycardia and severe hypotension after a challenge of one sublingual nitroglycerin.  She will start midodrine. The patient will return for further follow up.

## 2016-07-07 ENCOUNTER — Ambulatory Visit: Admit: 2016-07-07 | Discharge: 2016-07-07 | Attending: Clinical Cardiac Electrophysiology | Primary: Orthopaedic Surgery

## 2016-07-07 DIAGNOSIS — R55 Syncope and collapse: Secondary | ICD-10-CM

## 2016-07-07 NOTE — Progress Notes (Signed)
Cardiac Electrophysiology Office Consultation Note     Subjective:      Amber Stewart is a 41 y.o. patient who is seen for evaluation of recurrent syncope and near syncope for the past 15 years but she has had 5 events for the past 7 months  Usually these are precipitated by standing or doing something and stayed upright  She said florinef did not work and she has been told to consider tilt test before midodrine  She has no chest pain, palpitation  She had stress echo 2016 that was normal no ischemia infarct and LVEF normal  holter did not show pauses or av block in 2016.  There was mild sinus bradycardia    Since tilt table test 06/29/2016 with mixed response hypotension and sinus bradycardia she is started on midodrine  She has been taking 2 days so far because worker comp did not approve    Patient Active Problem List   Diagnosis Code   ??? Lupus L93.0   ??? Syncope and collapse R55     Current Outpatient Prescriptions   Medication Sig Dispense Refill   ??? midodrine (PROAMITINE) 2.5 mg tablet Take 1 Tab by mouth three (3) times daily (with meals) for 30 days. 90 Tab 3   ??? albuterol (PROVENTIL HFA, VENTOLIN HFA, PROAIR HFA) 90 mcg/actuation inhaler Take 2 Puffs by inhalation.     ??? diphenhydrAMINE (BENADRYL) 25 mg capsule Take 75 mg by mouth daily.     ??? traMADol (ULTRAM-ER) 100 mg Tb24 Take 100 mg by mouth as needed.     ??? gabapentin (NEURONTIN) 300 mg capsule Take 300 mg by mouth as needed.     ??? diazePAM (VALIUM) 2 mg tablet Take 2 mg by mouth as needed.     ??? ibuprofen (MOTRIN) 600 mg tablet Take  by mouth every six (6) hours as needed for Pain.     ??? naproxen sodium (ALEVE) 220 mg tablet Take 220 mg by mouth two (2) times daily (with meals).         Allergies   Allergen Reactions   ??? Latex Rash   ??? Adhesive Rash     Rash and blisters.    ??? Celebrex [Celecoxib] Other (comments)     Respiratory depression   ??? Doxycycline Nausea and Vomiting   ??? Iodine Rash     Respiratory difficulties   ??? Lithium Diarrhea    ??? Morphine Anaphylaxis   ??? Morphine Other (comments)     States cardiac arrhythmias   ??? Pcn [Penicillins] Rash and Other (comments)      fever   ??? Pcn [Penicillins] Other (comments)     Respiratory depression   ??? Percocet [Oxycodone-Acetaminophen] Rash      Heart racing    ??? Percocet [Oxycodone-Acetaminophen] Other (comments)     Respiratory depression   ??? Sulfa (Sulfonamide Antibiotics) Anaphylaxis   ??? Sulfa (Sulfonamide Antibiotics) Nausea and Vomiting     Past Medical History:   Diagnosis Date   ??? Asthma      Past Surgical History:   Procedure Laterality Date   ??? HX CHOLECYSTECTOMY     ??? HX TUBAL LIGATION     ??? TILT TABLE EVAL W/WO DRUG  06/29/2016            Social History   Substance Use Topics   ??? Smoking status: Current Every Day Smoker     Packs/day: 0.10     Types: Cigarettes   ??? Smokeless tobacco: Never Used   ???  Alcohol use Yes      Comment: "once every 2 weeks"        Review of Systems:   Constitutional: Negative for fever, chills, weight loss, malaise/fatigue.   HEENT: Negative for nosebleeds, vision changes.   Respiratory: Negative for cough, hemoptysis  Cardiovascular: Negative for chest pain, palpitations, orthopnea, claudication, leg swelling and PND.   Gastrointestinal: Negative for nausea, vomiting, diarrhea, blood in stool and melena.   Genitourinary: Negative for dysuria, and hematuria.   Musculoskeletal: Negative for myalgias, arthralgia.   Skin: Negative for rash.   Heme: Does not bleed or bruise easily.   Neurological: Negative for speech change and focal weakness     Objective:     Visit Vitals   ??? BP 110/78 (BP 1 Location: Right arm, BP Patient Position: Sitting)   ??? Pulse 88   ??? Ht 5\' 6"  (1.676 m)   ??? Wt 213 lb 3.2 oz (96.7 kg)   ??? BMI 34.41 kg/m2      Physical Exam:   Constitutional: well-developed and well-nourished. No respiratory distress.   Head: Normocephalic and atraumatic.   Eyes: Pupils are equal, round  ENT: hearing normal  Neck: supple. No JVD present.    Cardiovascular: Normal rate, regular rhythm. Exam reveals no gallop and no friction rub. No murmur heard.  Pulmonary/Chest: Effort normal and breath sounds normal. No wheezes.   Abdominal: Soft, mild obese  Musculoskeletal: no edema.   Neurological: alert, oriented.   Skin: Skin is warm and dry  Psychiatric: normal mood and affect. Behavior is normal. Judgment and thought content normal.         Assessment/Plan:       ICD-10-CM ICD-9-CM    1. Syncope and collapse R55 780.2    2. Rheumatoid arthritis of wrist, unspecified laterality, unspecified rheumatoid factor presence (HCC) M06.9 714.0      She has been adding salt and drinking water  She has rheumatoid arthritis and surgery last May and she has had 5 near syncope and syncope   She is getting the functional capacity evaluation and if not better we can increase to 5 mg tid with meals   Add compression stockings  She will call back as needed  RTC 3 months       Thank you for involving me in this patient's care and please call with further concerns or questions.      Juliet Rude, M.D.  Electrophysiology/Cardiology  [facility]  [address]                      [address] [place], [place] [address]                             [place] [address]  [phone]                                        [phone]

## 2016-07-07 NOTE — Patient Instructions (Signed)
Please wear compression stockings    You will need to follow up in clinic with Dr. Loma NewtonNgo in 3 months.

## 2016-07-07 NOTE — Progress Notes (Signed)
Chief Complaint   Patient presents with   ??? Follow-up     Tilt Table     Verified patient with two types of identifiers.   Verified medications with the patient.    Verified patient's pharmacy

## 2016-08-09 NOTE — Telephone Encounter (Signed)
Received fax from [facility] requested cardiac clearance for her upcoming functional capacity eval.  Will check with MD.

## 2016-08-10 NOTE — Telephone Encounter (Signed)
VO per Dr Loma NewtonNgo at low risk.

## 2016-08-10 NOTE — Telephone Encounter (Signed)
Faxed risk assessment to [facility] at fax number [phone].  Fax confirmation received.

## 2016-10-05 NOTE — Telephone Encounter (Signed)
Verified patient with two types of identifiers. Patient states that she is having surgery with Dr. Lockie Molahillon on 10/17/16 to remove hardware in her right arm that was origionally placed 06/2015. Patient states that she will need anesthesia. Will ask MD/NP.    Fax back to Dr. Lockie Molahillon at [phone]

## 2016-10-05 NOTE — Telephone Encounter (Signed)
Patient would like a call back to discuss her cardiac clearance for another upcoming procedure in the same arm as the last procedure. Patient stated she will have to have anesthesia.  Phone [phone]  Judeth CornfieldStephanie

## 2016-10-06 NOTE — Telephone Encounter (Signed)
Per Sharyl NimrodMeredith, NP "She has no contraindication to surgery, should be ok to proceed without further cardiac evaluation unless she has developed new chest pain or shortness of breath."    Faxed risk assessment letter to Dr. Lockie Molahillon at fax number [phone].  Fax confirmation received.

## 2016-10-11 NOTE — Telephone Encounter (Signed)
Amber Stewart with [facility] called for cardiac clearance. They need something stating that she is cleared as well as instructions as far as hydration and if and when she may stop medication prior to surgery on 10/17/16 (removal of hardware in the wrist)   Phone: [phone]  Fax: [phone]

## 2016-10-12 NOTE — Telephone Encounter (Signed)
Verified patient with two types of identifiers. Spoke to Holladayharlene and advised her that a clearance letter was faxed but that we can refax this.  She asked about her midodrine and also what to do with fluids during the procedure.  Advised her that all we do is risk assess and that this would be up the the MD doing the procedure.  She request that we refax to another fax number of [phone].  Faxed clearance to Charlene at fax number [phone].  Fax confirmation received.

## 2016-10-13 ENCOUNTER — Encounter: Attending: Clinical Cardiac Electrophysiology | Primary: Orthopaedic Surgery

## 2016-10-21 ENCOUNTER — Encounter: Attending: Specialist | Primary: Orthopaedic Surgery

## 2017-12-26 IMAGING — CR DG CHEST 2V
2 series · 2 of 2 positions shown · non-contrast
Comparison: None

CLINICAL DATA: Dry cough and chest tightness for 3.5 weeks, history
lupus, asthma, POTS, hypotension

EXAM:
CHEST  2 VIEW

[chest pa]
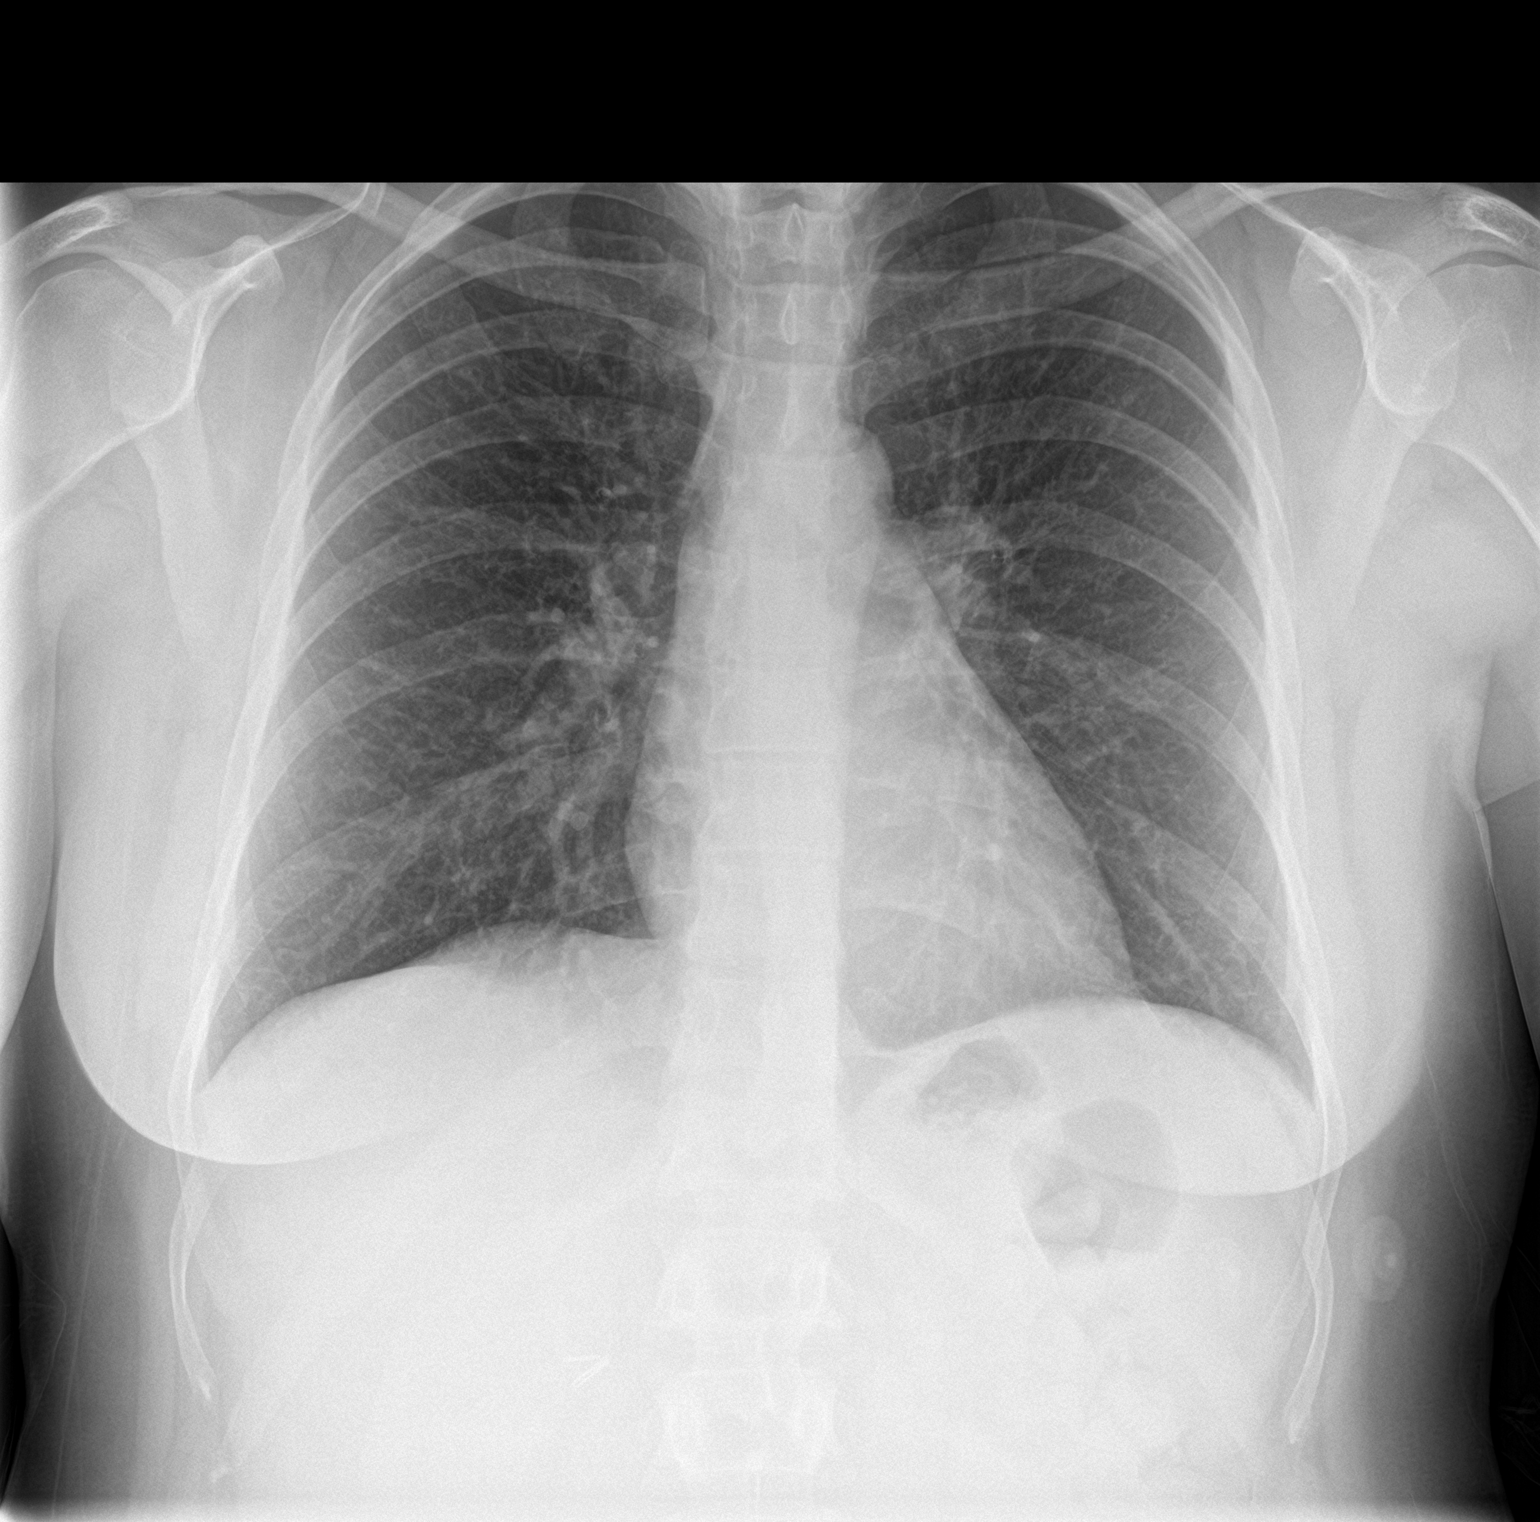

[chest lat]
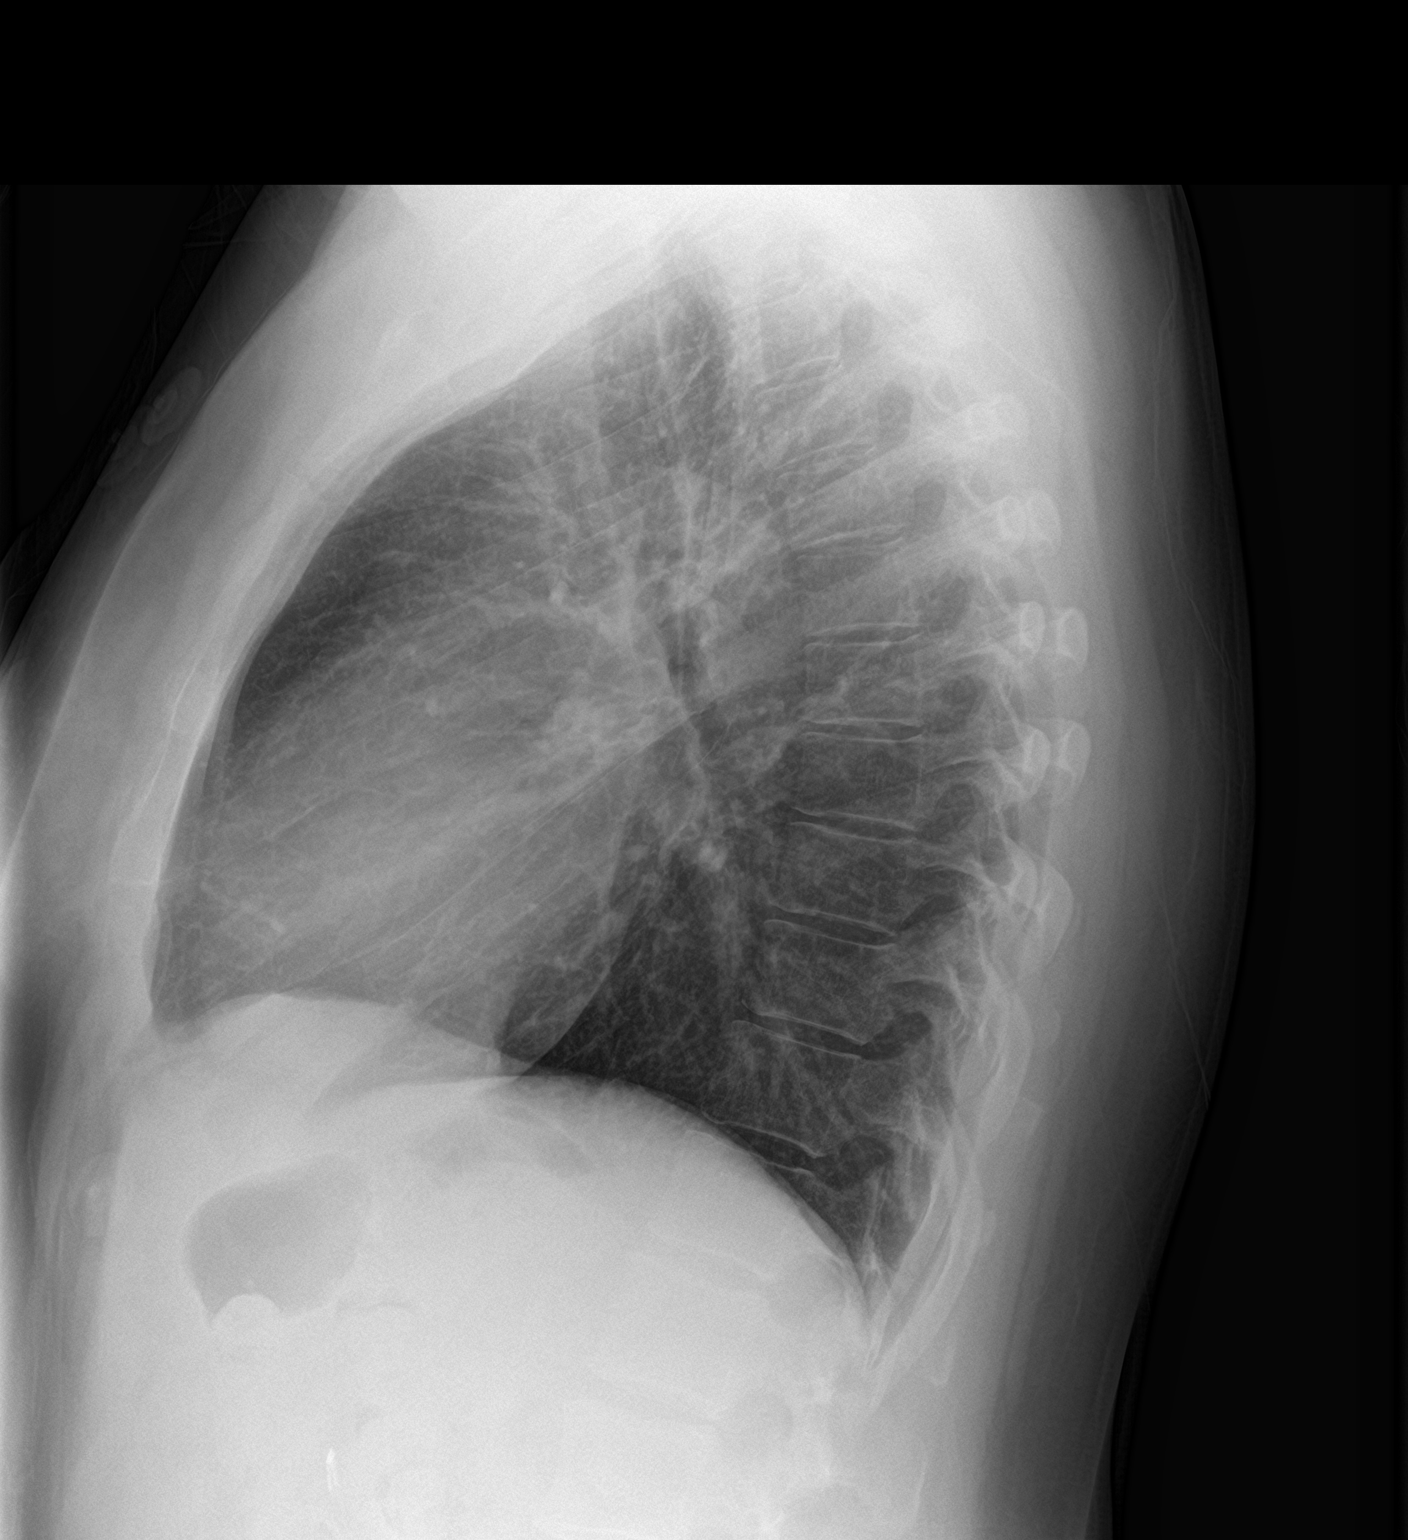

[2 of 2 positions shown; findings below may reference images not displayed]

FINDINGS: Normal heart size, mediastinal contours, and pulmonary vascularity.

Minimal peribronchial thickening.

No pulmonary infiltrate, pleural effusion, or pneumothorax.

Bones unremarkable.
IMPRESSION: Minimal peribronchial thickening which could reflect bronchitis or
asthma.

No acute infiltrate.

## 2019-07-16 ENCOUNTER — Encounter: Payer: Self-pay | Admitting: Family Medicine

## 2019-07-16 ENCOUNTER — Ambulatory Visit: Payer: Self-pay | Admitting: Family Medicine

## 2019-07-16 VITALS — BP 100/76 | HR 78 | Ht 66.0 in | Wt 216.0 lb

## 2019-07-16 NOTE — Progress Notes (Signed)
Subjective:     Patient ID: Sandra Mathis, female   DOB: [birth year], 44 y.o.   MRN: [id number]  HPI  Sandra Mathis presents to the employee health clinic today for her required wellness visit for insurance. She does not have a pcp as she just got insurance coverage. She plans on establishing care with her husband's PCP listed below. She will need to schedule a mammogram and pap smear as well.  PCP: Brock Ra, NP - [facility], needs to establish care.   Mammogram - 2.5 years ago, normal but dense tissue needs 3D Pap smear in 2017 - normal   uso surgery right side, left tube tied 2 right arm surgeries Gallbladder removal  Cut back method with husband quitting cigarettes ; down to 2 packs per week; 1/2 ppd x 25 years  Past Medical History:  Diagnosis Date  . Asthma   . Low blood pressure   . Lupus ([place])   . Rheumatoid arthritis (HCC)    Allergies  Allergen Reactions  . Iodine Hives and Shortness Of Breath  . Latex Shortness Of Breath and Rash  . Lithium Shortness Of Breath and Rash  . Morphine And Related Other (See Comments)    Cardiac arrest  . Oxycontin [Oxycodone] Shortness Of Breath  . Penicillins Other (See Comments)    Respiratory depression  . Percocet [Oxycodone-Acetaminophen] Shortness Of Breath  . Sulfa Antibiotics Shortness Of Breath  . Celebrex [Celecoxib] Other (See Comments)    GI distress  . Doxycycline Diarrhea  . Singulair [Montelukast]     GI distress  . Adhesive [Tape] Rash    Current Outpatient Medications:  .  albuterol (VENTOLIN HFA) 108 (90 Base) MCG/ACT inhaler, Inhale 2 puffs into the lungs every 6 (six) hours as needed for wheezing or shortness of breath., Disp: , Rfl:  .  cetirizine (ZYRTEC) 10 MG tablet, Take 10 mg by mouth daily., Disp: , Rfl:  .  diphenhydrAMINE (BENADRYL) 25 mg capsule, Take 25 mg by mouth every 6 (six) hours as needed., Disp: , Rfl:  .  albuterol (PROVENTIL) (2.5 MG/3ML) 0.083% nebulizer solution, Take 2.5 mg by  nebulization every 6 (six) hours as needed., Disp: , Rfl:    Review of Systems  Constitutional: Negative for chills, fatigue, fever and unexpected weight change.  HENT: Negative for congestion, ear pain, sinus pressure, sinus pain and sore throat.   Eyes: Negative for discharge and visual disturbance.  Respiratory: Negative for cough, shortness of breath and wheezing.   Cardiovascular: Negative for chest pain and leg swelling.  Gastrointestinal: Negative for abdominal pain, blood in stool, constipation, diarrhea, nausea and vomiting.  Genitourinary: Negative for difficulty urinating and hematuria.  Skin: Negative for color change.  Neurological: Negative for dizziness, weakness, light-headedness and headaches.  Hematological: Negative for adenopathy.  All other systems reviewed and are negative.      Objective:   Physical Exam Vitals and nursing note reviewed.  Constitutional:      General: She is not in acute distress.    Appearance: She is well-developed.  HENT:     Head: Normocephalic and atraumatic.  Cardiovascular:     Rate and Rhythm: Normal rate and regular rhythm.     Heart sounds: Normal heart sounds. No murmur.  Pulmonary:     Effort: Pulmonary effort is normal. No respiratory distress.     Breath sounds: Normal breath sounds.  Musculoskeletal:     Cervical back: Neck supple.  Skin:    General: Skin is warm and dry.  Neurological:     Mental Status: She is alert and oriented to person, place, and time.    Today's Vitals   07/16/19 1644  BP: 100/76  Pulse: 78  SpO2: 98%  Weight: 216 lb (98 kg)  Height: 5\' 6"  (1.676 m)   Body mass index is 34.86 kg/m.     Assessment:     Participant in health and wellness plan      Plan:     1. Encouraged to establish care with PCP and get caught up on health maintenance items - mammogram and pap smear.  2. Will draw basic screening lab work here next week when pt is fasting and she can take results with her to PCP  visit.  3. Encouraged healthy diet and exercise 4. F/u here prn.

## 2019-07-23 ENCOUNTER — Other Ambulatory Visit: Payer: Self-pay | Admitting: Family Medicine

## 2019-07-24 LAB — LIPID PANEL
Cholesterol: 184 mg/dL (ref <200)
HDL: 50 mg/dL (ref >=50)
LDL Cholesterol (Calc): 114 mg/dL (calc) — ABNORMAL HIGH
Non-HDL Cholesterol (Calc): 134 mg/dL (calc) — ABNORMAL HIGH (ref <130)
Total CHOL/HDL Ratio: 3.7 (calc) (ref <5.0)
Triglycerides: 102 mg/dL (ref <150)

## 2019-07-24 LAB — COMPLETE METABOLIC PANEL WITH GFR
AG Ratio: 1.2 (calc) (ref 1.0–2.5)
ALT: 29 U/L (ref 6–29)
AST: 27 U/L (ref 10–30)
Albumin: 4.1 g/dL (ref 3.6–5.1)
Alkaline phosphatase (APISO): 79 U/L (ref 31–125)
BUN: 14 mg/dL (ref 7–25)
CO2: 25 mmol/L (ref 20–32)
Calcium: 9.1 mg/dL (ref 8.6–10.2)
Chloride: 103 mmol/L (ref 98–110)
Creat: 0.9 mg/dL (ref 0.50–1.10)
GFR, Est African American: 91 mL/min/{1.73_m2} (ref >=60)
GFR, Est Non African American: 79 mL/min/{1.73_m2} (ref >=60)
Globulin: 3.3 g/dL (calc) (ref 1.9–3.7)
Glucose, Bld: 84 mg/dL (ref 65–99)
Potassium: 3.8 mmol/L (ref 3.5–5.3)
Sodium: 139 mmol/L (ref 135–146)
Total Bilirubin: 0.5 mg/dL (ref 0.2–1.2)
Total Protein: 7.4 g/dL (ref 6.1–8.1)

## 2019-07-24 LAB — CBC
HCT: 43.6 % (ref 35.0–45.0)
Hemoglobin: 14.7 g/dL (ref 11.7–15.5)
MCH: 29.2 pg (ref 27.0–33.0)
MCHC: 33.7 g/dL (ref 32.0–36.0)
MCV: 86.7 fL (ref 80.0–100.0)
MPV: 11.5 fL (ref 7.5–12.5)
Platelets: 208 10*3/uL (ref 140–400)
RBC: 5.03 10*6/uL (ref 3.80–5.10)
RDW: 12.7 % (ref 11.0–15.0)
WBC: 6.9 10*3/uL (ref 3.8–10.8)

## 2019-07-24 LAB — HEMOGLOBIN A1C W/OUT EAG: Hgb A1c MFr Bld: 5.3 % of total Hgb (ref <5.7)

## 2019-10-17 DIAGNOSIS — M0579 Rheumatoid arthritis with rheumatoid factor of multiple sites without organ or systems involvement: Secondary | ICD-10-CM | POA: Insufficient documentation

## 2019-10-29 DIAGNOSIS — D171 Benign lipomatous neoplasm of skin and subcutaneous tissue of trunk: Secondary | ICD-10-CM | POA: Insufficient documentation

## 2020-03-09 DIAGNOSIS — Z90721 Acquired absence of ovaries, unilateral: Secondary | ICD-10-CM | POA: Insufficient documentation

## 2020-03-23 DIAGNOSIS — Z720 Tobacco use: Secondary | ICD-10-CM | POA: Insufficient documentation

## 2020-07-14 ENCOUNTER — Ambulatory Visit: Payer: Self-pay | Admitting: Family Medicine

## 2020-07-14 VITALS — BP 128/82 | HR 86

## 2020-07-14 DIAGNOSIS — M329 Systemic lupus erythematosus, unspecified: Secondary | ICD-10-CM | POA: Insufficient documentation

## 2020-07-14 DIAGNOSIS — IMO0002 Reserved for concepts with insufficient information to code with codable children: Secondary | ICD-10-CM | POA: Insufficient documentation

## 2020-07-14 DIAGNOSIS — G90A Postural orthostatic tachycardia syndrome (POTS): Secondary | ICD-10-CM | POA: Insufficient documentation

## 2020-07-14 DIAGNOSIS — J019 Acute sinusitis, unspecified: Secondary | ICD-10-CM

## 2020-07-14 DIAGNOSIS — I498 Other specified cardiac arrhythmias: Secondary | ICD-10-CM | POA: Insufficient documentation

## 2020-07-14 MED ORDER — LEVOFLOXACIN 500 MG PO TABS: 500.0000 mg | ORAL_TABLET | Freq: Every day | ORAL | 0 refills | Status: DC

## 2020-07-14 NOTE — Progress Notes (Signed)
Subjective:     Patient ID: Sandra Mathis, female   DOB: 1976/01/18, 45 y.o.   MRN: [id number]  HPI  Sandra Mathis presents to the employee health and wellness clinic today for evaluation of possible sinus infection. She reports 3 weeks ago right ear starting hurting and draining fluid. She went to her PCP but no infection. She reports continued right ear pain and pressure, 2 week h/o pressure under her eyes, frontal h/a, congestion, and cough producing bright green phlegm. She denies fever. No shortness of breath.   She has been fully vaccinated and boosted against Covid-19. She was tested for covid-19 3 weeks ago for contact tracing and had 2 negative tests.   Past Medical History:  Diagnosis Date  . Asthma   . Low blood pressure   . Lupus ([place])   . Rheumatoid arthritis (HCC)    Allergies  Allergen Reactions  . Iodine Hives and Shortness Of Breath  . Latex Shortness Of Breath and Rash  . Lithium Shortness Of Breath and Rash  . Morphine And Related Other (See Comments)    Cardiac arrest  . Oxycontin [Oxycodone] Shortness Of Breath  . Penicillins Other (See Comments)    Respiratory depression  . Percocet [Oxycodone-Acetaminophen] Shortness Of Breath  . Sulfa Antibiotics Shortness Of Breath  . Celebrex [Celecoxib] Other (See Comments)    GI distress  . Doxycycline Diarrhea  . Singulair [Montelukast]     GI distress  . Adhesive [Tape] Rash    Current Outpatient Medications:  .  cetirizine (ZYRTEC) 10 MG tablet, Take 10 mg by mouth daily., Disp: , Rfl:  .  DEBLITANE 0.35 MG tablet, Take by mouth., Disp: , Rfl:  .  escitalopram (LEXAPRO) 10 MG tablet, Take 10 mg by mouth daily., Disp: , Rfl:  .  levofloxacin (LEVAQUIN) 500 MG tablet, Take 1 tablet (500 mg total) by mouth daily., Disp: 7 tablet, Rfl: 0 .  LINZESS 72 MCG capsule, Take 72 mcg by mouth every morning., Disp: , Rfl:  .  midodrine (PROAMATINE) 2.5 MG tablet, Take 2.5 mg by mouth 3 (three) times daily., Disp: , Rfl:  .   pantoprazole (PROTONIX) 40 MG tablet, Take 40 mg by mouth daily., Disp: , Rfl:  .  traZODone (DESYREL) 50 MG tablet, Take 50 mg by mouth at bedtime., Disp: , Rfl:  .  albuterol (PROVENTIL) (2.5 MG/3ML) 0.083% nebulizer solution, Take 2.5 mg by nebulization every 6 (six) hours as needed., Disp: , Rfl:  .  albuterol (VENTOLIN HFA) 108 (90 Base) MCG/ACT inhaler, Inhale 2 puffs into the lungs every 6 (six) hours as needed for wheezing or shortness of breath., Disp: , Rfl:  .  diphenhydrAMINE (BENADRYL) 25 mg capsule, Take 25 mg by mouth every 6 (six) hours as needed., Disp: , Rfl:  .  traMADol (ULTRAM) 50 MG tablet, SMARTSIG:1 By Mouth 4-5 Times Daily, Disp: , Rfl:    Review of Systems  Constitutional: Negative for activity change, appetite change, chills and fever.  HENT: Positive for congestion, ear discharge, ear pain, sinus pressure and sinus pain. Negative for facial swelling, hearing loss, sore throat and trouble swallowing.   Eyes: Negative for photophobia, pain and visual disturbance.  Respiratory: Positive for cough. Negative for shortness of breath and wheezing.   Cardiovascular: Negative for chest pain.  Gastrointestinal: Negative.   Genitourinary: Negative.   Musculoskeletal: Negative for neck pain and neck stiffness.  Skin: Negative for color change.  Neurological: Positive for headaches. Negative for dizziness and weakness.  Objective:   Physical Exam Vitals reviewed.  Constitutional:      General: She is not in acute distress.    Appearance: Normal appearance. She is well-developed.  HENT:     Head: Normocephalic and atraumatic.     Right Ear: Tympanic membrane and ear canal normal.     Left Ear: Tympanic membrane and ear canal normal.     Nose: Congestion present.     Right Sinus: Maxillary sinus tenderness present. No frontal sinus tenderness.     Left Sinus: Maxillary sinus tenderness present. No frontal sinus tenderness.  Eyes:     General:        Right eye:  No discharge.        Left eye: No discharge.     Conjunctiva/sclera: Conjunctivae normal.  Cardiovascular:     Rate and Rhythm: Normal rate and regular rhythm.     Heart sounds: Normal heart sounds.  Pulmonary:     Effort: Pulmonary effort is normal. No respiratory distress.     Breath sounds: Normal breath sounds. No wheezing, rhonchi or rales.  Musculoskeletal:     Cervical back: Neck supple. No rigidity.  Lymphadenopathy:     Cervical: No cervical adenopathy.  Skin:    General: Skin is warm and dry.  Neurological:     Mental Status: She is alert and oriented to person, place, and time.  Psychiatric:        Mood and Affect: Mood normal.        Behavior: Behavior normal.    Today's Vitals   07/14/20 0823  BP: 128/82  Pulse: 86  SpO2: 97%   There is no height or weight on file to calculate BMI.      Assessment:     Acute rhinosinusitis      Plan:     1. Given duration of symptoms and sinus tenderness will go ahead and treat with abx. Unfortunately pt has many allergies and reports only abx she is able to tolerate are fluoroquinolones. Levaquin prescribed, pt educated on potential adverse effects. If symptoms worsen or fail to improve f/u with PCP or urgent care prn.  2. Recommend she get tested again for covid-19. She is able to get this done at her work.

## 2021-12-27 ENCOUNTER — Other Ambulatory Visit: Payer: Self-pay | Admitting: Internal Medicine

## 2021-12-27 ENCOUNTER — Other Ambulatory Visit: Payer: Self-pay | Admitting: Physician Assistant

## 2021-12-27 DIAGNOSIS — M25521 Pain in right elbow: Secondary | ICD-10-CM

## 2022-01-08 ENCOUNTER — Ambulatory Visit
Admission: RE | Admit: 2022-01-08 | Discharge: 2022-01-08 | Disposition: A | Discharge status: Home / Self Care | Payer: Self-pay | Source: Ambulatory Visit | Attending: Physician Assistant | Admitting: Physician Assistant

## 2022-01-08 DIAGNOSIS — M25521 Pain in right elbow: Secondary | ICD-10-CM

## 2022-04-14 ENCOUNTER — Other Ambulatory Visit: Payer: Self-pay

## 2022-04-14 ENCOUNTER — Encounter (HOSPITAL_BASED_OUTPATIENT_CLINIC_OR_DEPARTMENT_OTHER): Payer: Self-pay | Admitting: Emergency Medicine

## 2022-04-14 ENCOUNTER — Emergency Department (HOSPITAL_BASED_OUTPATIENT_CLINIC_OR_DEPARTMENT_OTHER)
Admission: EM | Admit: 2022-04-14 | Discharge: 2022-04-15 | Disposition: A | Discharge status: Home / Self Care | Payer: Self-pay | Attending: Emergency Medicine | Admitting: Emergency Medicine

## 2022-04-14 DIAGNOSIS — S83411A Sprain of medial collateral ligament of right knee, initial encounter: Secondary | ICD-10-CM | POA: Insufficient documentation

## 2022-04-14 DIAGNOSIS — J45909 Unspecified asthma, uncomplicated: Secondary | ICD-10-CM | POA: Insufficient documentation

## 2022-04-14 DIAGNOSIS — S93402A Sprain of unspecified ligament of left ankle, initial encounter: Secondary | ICD-10-CM | POA: Insufficient documentation

## 2022-04-14 DIAGNOSIS — Z91040 Latex allergy status: Secondary | ICD-10-CM | POA: Insufficient documentation

## 2022-04-14 DIAGNOSIS — F17210 Nicotine dependence, cigarettes, uncomplicated: Secondary | ICD-10-CM | POA: Insufficient documentation

## 2022-04-14 DIAGNOSIS — S7001XA Contusion of right hip, initial encounter: Secondary | ICD-10-CM | POA: Insufficient documentation

## 2022-04-14 DIAGNOSIS — W010XXA Fall on same level from slipping, tripping and stumbling without subsequent striking against object, initial encounter: Secondary | ICD-10-CM | POA: Insufficient documentation

## 2022-04-14 MED ORDER — KETOROLAC TROMETHAMINE 30 MG/ML IJ SOLN
30.0000 mg | Freq: Once | INTRAMUSCULAR | Status: DC
  Filled 2022-04-14: qty 1

## 2022-04-14 NOTE — ED Triage Notes (Signed)
Patient arrived via POV c/o left ankle injury 5 hr pta. Patient states slipping off curb, with left ankle touching ground. Patient states "felt something pop". Left foot is cold to touch, good circulation, no sensation in foot. Patient states 8/10 pain. Patient unable to bear weight. Patient is AO x 4, VS WDL, unable to walk.

## 2022-04-14 NOTE — ED Provider Notes (Signed)
[facility] Provider Note: Georgena Spurling, MD, FACEP  CSN: [id number] MRN: [id number] ARRIVAL: 04/14/22 at 2252 ROOM: [place] OF PRESENT ILLNESS  04/14/22 11:41 PM Sandra Mathis is a 46 y.o. female who stepped off a curb about 4 PM.  She inverted her left ankle and felt something pop.  She rates associated pain in that ankle as an 8 out of 10.  She is unable to bear weight on that foot.  She states she has lost light touch sensation in her left toes.  When she injured her left ankle she fell onto her right side and is having pain in her medial right knee, right hip and right upper arm.  She is able to bear weight on her right leg.  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   Past Medical History:  Diagnosis Date   Arthritis    Asthma    Low blood pressure    Lupus (HCC)    POTS (postural orthostatic tachycardia syndrome)    Rheumatoid arthritis ([place])     History reviewed. No pertinent surgical history.  No family history on file.  Social History   Tobacco Use   Smoking status: Every Day    Types: Cigarettes   Smokeless tobacco: Never  Vaping Use   Vaping Use: Never used  Substance Use Topics   Alcohol use: Yes    Comment: social   Drug use: Never    Prior to Admission medications   Medication Sig Start Date End Date Taking? Authorizing Provider  HYDROmorphone (DILAUDID) 2 MG tablet Take 0.5 tablets (1 mg total) by mouth every 4 (four) hours as needed for severe pain. 04/15/22  Yes Luva Metzger, MD  albuterol (PROVENTIL HFA;VENTOLIN HFA) 108 (90 Base) MCG/ACT inhaler Inhale 2 puffs into the lungs every 6 (six) hours as needed for wheezing or shortness of breath.    [provider]  albuterol (PROVENTIL) (2.5 MG/3ML) 0.083% nebulizer solution Take 2.5 mg by nebulization every 6 (six) hours  as needed. 06/13/19   [provider]  albuterol (VENTOLIN HFA) 108 (90 Base) MCG/ACT inhaler Inhale 2 puffs into the lungs every 6 (six) hours as needed for wheezing or shortness of breath.    [provider]  cetirizine (ZYRTEC) 10 MG tablet Take 10 mg by mouth daily.    [provider]  DEBLITANE 0.35 MG tablet Take by mouth. 06/19/20   [provider]  diazepam (VALIUM) 2 MG tablet Take 2 mg by mouth at bedtime.    [provider]  diclofenac sodium (VOLTAREN) 1 % GEL Apply 2 g topically 4 (four) times daily.    [provider]  diphenhydrAMINE (BENADRYL) 25 mg capsule Take 25 mg by mouth every 6 (six) hours as needed.    [provider]  diphenhydrAMINE (BENADRYL) 50 MG capsule Take 50 mg by mouth daily.    [provider]  escitalopram (LEXAPRO) 10 MG tablet Take 10 mg by mouth daily. 06/21/20   [provider]  gabapentin (NEURONTIN) 300 MG capsule Take 300 mg by mouth every 6 (six) hours as needed (arthritis).    [provider]  ibuprofen (ADVIL,MOTRIN) 800 MG tablet Take 800 mg by mouth every 6 (six) hours as needed for mild pain.    [provider]  Rolan Lipa  72 MCG capsule Take 72 mcg by mouth every morning. 06/21/20   [provider]  midodrine (PROAMATINE) 2.5 MG tablet Take 2.5 mg by mouth 3 (three) times daily. 05/18/20   [provider]  pantoprazole (PROTONIX) 40 MG tablet Take 40 mg by mouth daily. 06/21/20   [provider]  Phenylephrine-DM-GG-APAP (TYLENOL COLD/FLU SEVERE PO) Take 10 mLs by mouth daily as needed (cough / congestion).    [provider]  traZODone (DESYREL) 50 MG tablet Take 50 mg by mouth at bedtime. 04/03/20   [provider]    Allergies Iodine, Latex, Lithium, Morphine and related, Oxycontin [oxycodone], Penicillins, Percocet [oxycodone-acetaminophen], Pineapple flavor, Shellfish allergy, Sulfa antibiotics, Adhesive  [tape], Celebrex [celecoxib], Codeine, Doxycycline, Doxycycline, Iodine, Lithium, Lyrica [pregabalin], Morphine and related, Penicillins, Percocet [oxycodone-acetaminophen], Singulair [montelukast sodium], Singulair [montelukast], Sulfa antibiotics, and Adhesive [tape]   REVIEW OF SYSTEMS  Negative except as noted here or in the History of Present Illness.   PHYSICAL EXAMINATION  Initial Vital Signs Blood pressure 121/65, pulse 90, temperature 98.7 F (37.1 C), temperature source Oral, resp. rate 18, height '5\' 5"'$  (1.651 m), weight 99.8 kg, SpO2 98 %.  Examination General: Well-developed, well-nourished female in no acute distress; appearance consistent with age of record HENT: normocephalic; atraumatic Eyes: Normal appearance Neck: supple Heart: regular rate and rhythm Lungs: clear to auscultation bilaterally Abdomen: soft; nondistended; nontender; bowel sounds present Extremities: Swelling and tenderness of left ankle lateral malleolus with decreased sensation of toes distally but intact pulses and capillary refill; tenderness of medial collateral ligament of right knee; tenderness over right hip; tenderness of anterior muscles of right upper arm Neurologic: Awake, alert and oriented; motor function intact in all extremities and symmetric; no facial droop Skin: Warm and dry Psychiatric: Normal mood and affect   RESULTS  Summary of this visit's results, reviewed and interpreted by myself:   EKG Interpretation  Date/Time:    Ventricular Rate:    PR Interval:    QRS Duration:   QT Interval:    QTC Calculation:   R Axis:     Text Interpretation:         Laboratory Studies: Results for orders placed or performed during the hospital encounter of 04/14/22 (from the past 24 hour(s))  hCG, serum, qualitative     Status: None   Collection Time: 04/15/22 12:15 AM  Result Value Ref Range   Preg, Serum NEGATIVE NEGATIVE   Imaging Studies: DG Knee Complete 4 Views  Right  Result Date: 04/15/2022 CLINICAL DATA:  Status post fall. EXAM: RIGHT KNEE - COMPLETE 4+ VIEW COMPARISON:  None Available. FINDINGS: No evidence of fracture, dislocation, or joint effusion. No evidence of arthropathy or other focal bone abnormality. Soft tissues are unremarkable. IMPRESSION: Negative. Electronically Signed   By: Virgina Norfolk M.D.   On: 04/15/2022 01:54   DG Ankle Complete Left  Result Date: 04/15/2022 CLINICAL DATA:  Status post fall. EXAM: LEFT ANKLE COMPLETE - 3+ VIEW COMPARISON:  None Available. FINDINGS: There is no evidence of fracture, dislocation, or joint effusion. There is no evidence of arthropathy or other focal bone abnormality. Moderate severity diffuse soft tissue swelling is seen, slightly more prominent along the lateral aspect of the left ankle. IMPRESSION: Moderate severity diffuse soft tissue swelling without evidence of an acute osseous abnormality. Electronically Signed   By: Virgina Norfolk M.D.   On: 04/15/2022 01:53   DG Hip Unilat W or Wo Pelvis 2-3 Views Left  Result Date: 04/15/2022 CLINICAL DATA:  Status post fall. EXAM: DG HIP (WITH OR WITHOUT PELVIS) 2-3V LEFT COMPARISON:  None Available. FINDINGS: There is no evidence of hip fracture or dislocation. There is no evidence of arthropathy or other focal bone abnormality. IMPRESSION: Negative. Electronically Signed   By: Virgina Norfolk M.D.   On: 04/15/2022 01:52   DG Humerus Right  Result Date: 04/15/2022 CLINICAL DATA:  Status post fall. EXAM: RIGHT HUMERUS - 2+ VIEW COMPARISON:  None Available. FINDINGS: There is no evidence of fracture or other focal bone lesions. Soft tissues are unremarkable. IMPRESSION: Negative. Electronically Signed   By: Virgina Norfolk M.D.   On: 04/15/2022 01:52    ED COURSE and MDM  Nursing notes, initial and subsequent vitals signs, including pulse oximetry, reviewed and interpreted by myself.  Vitals:   04/14/22 2259 04/14/22 2328  BP: 121/65    Pulse: 90   Resp: 18   Temp: 98.7 F (37.1 C)   TempSrc: Oral   SpO2: 98%   Weight:  99.8 kg  Height:  '5\' 5"'$  (1.651 m)   Medications  ketorolac (TORADOL) 15 MG/ML injection 15 mg (15 mg Intravenous Given 04/15/22 0013)   No evidence of fracture.  We will place the patient's left ankle in an ASO in her right knee in a knee sleeve.  She already has crutches.  She is an established patient with [facility].   PROCEDURES  Procedures   ED DIAGNOSES     ICD-10-CM   1. Fall from slip, trip, or stumble, initial encounter  W01.0XXA     2. Sprain of left ankle, unspecified ligament, initial encounter  S93.402A     3. Sprain of medial collateral ligament of right knee, initial encounter  S83.411A     4. Contusion of right hip, initial encounter  S70.01XA          Elmina Hendel, Jenny Reichmann, MD 04/15/22 [phone]

## 2022-04-15 ENCOUNTER — Emergency Department (HOSPITAL_BASED_OUTPATIENT_CLINIC_OR_DEPARTMENT_OTHER): Payer: Self-pay

## 2022-04-15 LAB — HCG, SERUM, QUALITATIVE: Preg, Serum: NEGATIVE

## 2022-04-15 MED ORDER — HYDROMORPHONE HCL 2 MG PO TABS: 1.0000 mg | ORAL_TABLET | ORAL | 0 refills | Status: DC | PRN

## 2022-04-15 MED ORDER — KETOROLAC TROMETHAMINE 15 MG/ML IJ SOLN
15.0000 mg | Freq: Once | INTRAMUSCULAR | Status: AC
  Administered 2022-04-15: 15 mg via INTRAVENOUS

## 2022-06-29 ENCOUNTER — Emergency Department (HOSPITAL_COMMUNITY)
Admission: EM | Admit: 2022-06-29 | Discharge: 2022-06-29 | Discharge status: Against Medical Advice | Payer: Self-pay | Attending: Emergency Medicine | Admitting: Emergency Medicine

## 2022-06-29 DIAGNOSIS — I10 Essential (primary) hypertension: Secondary | ICD-10-CM | POA: Insufficient documentation

## 2022-06-29 DIAGNOSIS — M25512 Pain in left shoulder: Secondary | ICD-10-CM | POA: Insufficient documentation

## 2022-06-29 DIAGNOSIS — Z5321 Procedure and treatment not carried out due to patient leaving prior to being seen by health care provider: Secondary | ICD-10-CM | POA: Insufficient documentation

## 2022-06-29 DIAGNOSIS — R079 Chest pain, unspecified: Secondary | ICD-10-CM | POA: Insufficient documentation

## 2022-06-29 NOTE — ED Notes (Signed)
Pt requested her IV be removed bc she is leaving to go to another hospital

## 2022-06-29 NOTE — ED Triage Notes (Signed)
Pt BIB [facility] from PCP d/t sudden onset Rt sided CP that radiates down her Rt arm & into her neck. Also c/o Lt sided shoulder blade pain that feels like a knot the size of a golf ball. Her pain increase with movement, EMS reports her 12 L should inverted T waves & she rated her pain 8/10 before given 324 ASA & I Nitro while en route to ED then her pain decreased to 4/10, A/Ox4. 18g Rt AC, 130/94, 86 bpm, 96% on RA does have Hx of Lupus & HTN.

## 2023-03-07 ENCOUNTER — Encounter (HOSPITAL_BASED_OUTPATIENT_CLINIC_OR_DEPARTMENT_OTHER): Payer: Self-pay | Admitting: Emergency Medicine

## 2023-03-07 ENCOUNTER — Emergency Department (HOSPITAL_COMMUNITY): Payer: Commercial Managed Care - PPO

## 2023-03-07 ENCOUNTER — Other Ambulatory Visit: Payer: Self-pay

## 2023-03-07 ENCOUNTER — Emergency Department (HOSPITAL_BASED_OUTPATIENT_CLINIC_OR_DEPARTMENT_OTHER)
Admission: EM | Admit: 2023-03-07 | Discharge: 2023-03-07 | Disposition: A | Discharge status: Home / Self Care | Payer: Commercial Managed Care - PPO | Attending: Emergency Medicine | Admitting: Emergency Medicine

## 2023-03-07 ENCOUNTER — Emergency Department (HOSPITAL_BASED_OUTPATIENT_CLINIC_OR_DEPARTMENT_OTHER): Payer: Commercial Managed Care - PPO

## 2023-03-07 DIAGNOSIS — Z20822 Contact with and (suspected) exposure to covid-19: Secondary | ICD-10-CM | POA: Insufficient documentation

## 2023-03-07 DIAGNOSIS — R479 Unspecified speech disturbances: Secondary | ICD-10-CM | POA: Diagnosis not present

## 2023-03-07 DIAGNOSIS — Z91040 Latex allergy status: Secondary | ICD-10-CM | POA: Diagnosis not present

## 2023-03-07 DIAGNOSIS — G43809 Other migraine, not intractable, without status migrainosus: Secondary | ICD-10-CM | POA: Diagnosis not present

## 2023-03-07 DIAGNOSIS — R2990 Unspecified symptoms and signs involving the nervous system: Secondary | ICD-10-CM

## 2023-03-07 DIAGNOSIS — R519 Headache, unspecified: Secondary | ICD-10-CM | POA: Diagnosis not present

## 2023-03-07 LAB — CBC WITH DIFFERENTIAL/PLATELET
Abs Immature Granulocytes: 0.03 10*3/uL (ref 0.00–0.07)
Basophils Absolute: 0 10*3/uL (ref 0.0–0.1)
Basophils Relative: 0 %
Eosinophils Absolute: 0.3 10*3/uL (ref 0.0–0.5)
Eosinophils Relative: 3 %
HCT: 38.7 % (ref 36.0–46.0)
Hemoglobin: 13.1 g/dL (ref 12.0–15.0)
Immature Granulocytes: 0 %
Lymphocytes Relative: 21 %
Lymphs Abs: 1.6 10*3/uL (ref 0.7–4.0)
MCH: 29.2 pg (ref 26.0–34.0)
MCHC: 33.9 g/dL (ref 30.0–36.0)
MCV: 86.2 fL (ref 80.0–100.0)
Monocytes Absolute: 0.4 10*3/uL (ref 0.1–1.0)
Monocytes Relative: 6 %
Neutro Abs: 5 10*3/uL (ref 1.7–7.7)
Neutrophils Relative %: 70 %
Platelets: 171 10*3/uL (ref 150–400)
RBC: 4.49 MIL/uL (ref 3.87–5.11)
RDW: 13.3 % (ref 11.5–15.5)
WBC: 7.3 10*3/uL (ref 4.0–10.5)
nRBC: 0 % (ref 0.0–0.2)

## 2023-03-07 LAB — COMPREHENSIVE METABOLIC PANEL
ALT: 55 U/L — ABNORMAL HIGH (ref 0–44)
AST: 45 U/L — ABNORMAL HIGH (ref 15–41)
Albumin: 3.8 g/dL (ref 3.5–5.0)
Alkaline Phosphatase: 72 U/L (ref 38–126)
Anion gap: 9 (ref 5–15)
BUN: 12 mg/dL (ref 6–20)
CO2: 24 mmol/L (ref 22–32)
Calcium: 8.6 mg/dL — ABNORMAL LOW (ref 8.9–10.3)
Chloride: 101 mmol/L (ref 98–111)
Creatinine, Ser: 0.84 mg/dL (ref 0.44–1.00)
GFR, Estimated: >60 mL/min (ref >60)
Glucose, Bld: 107 mg/dL — ABNORMAL HIGH (ref 70–99)
Potassium: 3.4 mmol/L — ABNORMAL LOW (ref 3.5–5.1)
Sodium: 134 mmol/L — ABNORMAL LOW (ref 135–145)
Total Bilirubin: 0.4 mg/dL (ref 0.3–1.2)
Total Protein: 8.2 g/dL — ABNORMAL HIGH (ref 6.5–8.1)

## 2023-03-07 LAB — GROUP A STREP BY PCR: Group A Strep by PCR: NOT DETECTED

## 2023-03-07 LAB — RESP PANEL BY RT-PCR (RSV, FLU A&B, COVID)  RVPGX2
Influenza A by PCR: NEGATIVE
Influenza B by PCR: NEGATIVE
Resp Syncytial Virus by PCR: NEGATIVE
SARS Coronavirus 2 by RT PCR: NEGATIVE

## 2023-03-07 MED ORDER — PROCHLORPERAZINE EDISYLATE 10 MG/2ML IJ SOLN
10.0000 mg | Freq: Once | INTRAMUSCULAR | Status: AC
  Administered 2023-03-07: 10 mg via INTRAVENOUS
  Filled 2023-03-07: qty 2

## 2023-03-07 MED ORDER — DIPHENHYDRAMINE HCL 50 MG/ML IJ SOLN
12.5000 mg | Freq: Once | INTRAMUSCULAR | Status: AC
  Administered 2023-03-07: 12.5 mg via INTRAVENOUS
  Filled 2023-03-07: qty 1

## 2023-03-07 MED ORDER — GADOBUTROL 1 MMOL/ML IV SOLN
10.0000 mL | Freq: Once | INTRAVENOUS | Status: AC | PRN
  Administered 2023-03-07: 10 mL via INTRAVENOUS

## 2023-03-07 MED ORDER — BUTALBITAL-APAP-CAFFEINE 50-325-40 MG PO TABS
1.0000 | ORAL_TABLET | Freq: Three times a day (TID) | ORAL | 0 refills | Status: DC | PRN
  Filled 2023-03-07: qty 12, 4d supply, fill #0

## 2023-03-07 MED ORDER — MIDAZOLAM HCL 2 MG/2ML IJ SOLN
1.0000 mg | Freq: Once | INTRAMUSCULAR | Status: AC | PRN
  Administered 2023-03-07: 1 mg via INTRAVENOUS
  Filled 2023-03-07: qty 2

## 2023-03-07 MED ORDER — SODIUM CHLORIDE 0.9 % IV SOLN: INTRAVENOUS | Status: DC | PRN

## 2023-03-07 NOTE — ED Provider Notes (Signed)
Received signout; transferred from [facility] for MRI for headache  Reevaluated, minor improvement of headache  Physical Exam  BP (!) 116/50   Pulse 68   Temp 98.1 F (36.7 C)   Resp 17   Wt 104.3 kg   SpO2 100%   BMI 38.27 kg/m   Physical Exam Vitals reviewed.  HENT:     Head: Normocephalic.  Cardiovascular:     Rate and Rhythm: Normal rate.  Pulmonary:     Effort: Pulmonary effort is normal.  Neurological:     Mental Status: She is alert and oriented to person, place, and time.     GCS: GCS eye subscore is 4. GCS verbal subscore is 5. GCS motor subscore is 6.     Comments: Coordinated movements in extremities with no gross motor deficits     Procedures  Procedures  ED Course / MDM   Clinical Course as of 03/07/23 2146  Tue Mar 07, 2023  1612 Signout; transfer for MRI; suspected complicated migraine r/o intracranial pathology.  [TY]  2139 MRI negative [TY]    Clinical Course User Index [TY] Coral Spikes, DO   Medical Decision Making 47 year old presented to emergency department with headache; transferred for MRI.  See prior team's note for full HPI.  Vital signs and labs reviewed.  Mild hyponatremia-both kalemia.  Transaminitis similarly chronic no leukocytosis to suggest systemic infection.  Moving neck freely.  Flu COVID strep negative.  MRIs also negative for acute pathology.  Received migraine cocktail here.  Patient feels comfortable going home.  Follow-up with neurology.  Will discharge with Fioricet  Amount and/or Complexity of Data Reviewed Labs: ordered. Radiology: ordered.  Risk Prescription drug management.         Coral Spikes, DO 03/07/23 2146

## 2023-03-07 NOTE — ED Provider Notes (Signed)
Sandra Mathis   CSN: [id number] Arrival date & time: 03/07/23  1113     History  Chief Complaint  Patient presents with   Headache    Sandra Mathis is a 47 y.o. female.  Patient here with headache, blurred/double vision/some difficulty with speech intermittently.  History of POTS, rheumatoid arthritis.  She states that she had a bad headache last night around 10:00 had some difficulty getting words out but took headache medicine with the bed.  Woke up still with a headache today.  Still with some intermittent difficulty with speech.  No recent trouble swallowing.  Has not noticed any weakness or numbness during that time revealed facial droop but now thinks her right leg might be weak does not really know when that started.  She does have some chronic right knee pain.  Denies any chest pain shortness of breath.  She had a little bit of a cough but not anymore.  The history is provided by the patient.       Home Medications Prior to Admission medications   Medication Sig Start Date End Date Taking? Authorizing Provider  albuterol (PROVENTIL HFA;VENTOLIN HFA) 108 (90 Base) MCG/ACT inhaler Inhale 2 puffs into the lungs every 6 (six) hours as needed for wheezing or shortness of breath.    [provider]  albuterol (PROVENTIL) (2.5 MG/3ML) 0.083% nebulizer solution Take 2.5 mg by nebulization every 6 (six) hours as needed. 06/13/19   [provider]  albuterol (VENTOLIN HFA) 108 (90 Base) MCG/ACT inhaler Inhale 2 puffs into the lungs every 6 (six) hours as needed for wheezing or shortness of breath.    [provider]  cetirizine (ZYRTEC) 10 MG tablet Take 10 mg by mouth daily.    [provider]  DEBLITANE 0.35 MG tablet Take by mouth. 06/19/20   [provider]  diazepam (VALIUM) 2 MG tablet Take 2 mg by mouth at bedtime.    [provider]  diclofenac sodium (VOLTAREN) 1 %  GEL Apply 2 g topically 4 (four) times daily.    [provider]  diphenhydrAMINE (BENADRYL) 25 mg capsule Take 25 mg by mouth every 6 (six) hours as needed.    [provider]  diphenhydrAMINE (BENADRYL) 50 MG capsule Take 50 mg by mouth daily.    [provider]  escitalopram (LEXAPRO) 10 MG tablet Take 10 mg by mouth daily. 06/21/20   [provider]  gabapentin (NEURONTIN) 300 MG capsule Take 300 mg by mouth every 6 (six) hours as needed (arthritis).    [provider]  HYDROmorphone (DILAUDID) 2 MG tablet Take 0.5 tablets (1 mg total) by mouth every 4 (four) hours as needed for severe pain. 04/15/22   Molpus, John, MD  ibuprofen (ADVIL,MOTRIN) 800 MG tablet Take 800 mg by mouth every 6 (six) hours as needed for mild pain.    [provider]  LINZESS 72 MCG capsule Take 72 mcg by mouth every morning. 06/21/20   [provider]  midodrine (PROAMATINE) 2.5 MG tablet Take 2.5 mg by mouth 3 (three) times daily. 05/18/20   [provider]  pantoprazole (PROTONIX) 40 MG tablet Take 40 mg by mouth daily. 06/21/20   [provider]  Phenylephrine-DM-GG-APAP (TYLENOL COLD/FLU SEVERE PO) Take 10 mLs by mouth daily as needed (cough / congestion).    [provider]  traZODone (DESYREL) 50 MG tablet Take 50 mg by mouth at bedtime. 04/03/20   [provider]      Allergies    Iodine, Latex, Lithium, Morphine and codeine, Oxycontin [oxycodone], Penicillins, Percocet [oxycodone-acetaminophen], Pineapple flavor, Shellfish allergy, Sulfa antibiotics, Adhesive [tape], Celebrex [celecoxib], Codeine, Doxycycline, Doxycycline, Iodine, Lithium, Lyrica [pregabalin], Morphine and codeine, Penicillins, Percocet [oxycodone-acetaminophen], Singulair [montelukast sodium], Singulair [montelukast], Sulfa antibiotics, and Adhesive [tape]    Review of Systems   Review of Systems  Physical Exam Updated Vital Signs BP 92/69    Pulse 72   Temp (!) 97 F (36.1 C)   Resp 16   Wt 104.3 kg   SpO2 98%   BMI 38.27 kg/m  Physical Exam Vitals and nursing Mathis reviewed.  Constitutional:      General: She is not in acute distress.    Appearance: She is well-developed.  HENT:     Head: Normocephalic and atraumatic.  Eyes:     Extraocular Movements: Extraocular movements intact.     Right eye: Normal extraocular motion and no nystagmus.     Left eye: Normal extraocular motion and no nystagmus.     Conjunctiva/sclera: Conjunctivae normal.     Pupils: Pupils are equal, round, and reactive to light.     Right eye: Pupil is reactive.     Left eye: Pupil is reactive.  Cardiovascular:     Rate and Rhythm: Normal rate and regular rhythm.     Heart sounds: No murmur heard. Pulmonary:     Effort: Pulmonary effort is normal. No respiratory distress.     Breath sounds: Normal breath sounds.  Abdominal:     Palpations: Abdomen is soft.     Tenderness: There is no abdominal tenderness.  Musculoskeletal:        General: No swelling. Normal range of motion.     Cervical back: Normal range of motion and neck supple.  Skin:    General: Skin is warm and dry.     Capillary Refill: Capillary refill takes less than 2 seconds.  Neurological:     Mental Status: She is alert and oriented to person, place, and time.     Comments: Visual fields appear to be intact with patient in the right eye with the left eye covered does see 2 fingers on hold up 1, there is no nystagmus maybe some mild weakness in the right lower leg with some decrease sensation in the right leg compared to the left leg but normal strength and sensation elsewhere.  Normal finger-nose-finger, normal speech, normal heel-to-shin, cranial nerves appear to be intact  Psychiatric:        Mood and Affect: Mood normal.     ED Results / Procedures / Treatments   Labs (all labs ordered are listed, but only abnormal results are displayed) Labs Reviewed  COMPREHENSIVE  METABOLIC PANEL - Abnormal; Notable for the following components:      Result Value   Sodium 134 (*)    Potassium 3.4 (*)    Glucose, Bld 107 (*)    Calcium 8.6 (*)    Total Protein 8.2 (*)    AST 45 (*)    ALT 55 (*)    All other components within normal limits  RESP PANEL BY RT-PCR (RSV, FLU A&B, COVID)  [id number]  GROUP A STREP BY PCR  CBC WITH DIFFERENTIAL/PLATELET    EKG None  Radiology No results found.  Procedures Procedures    Medications Ordered in ED Medications  0.9 %  sodium chloride infusion ( Intravenous New Bag/Given 03/07/23 1320)  prochlorperazine (COMPAZINE) injection 10 mg (  10 mg Intravenous Given by Other 03/07/23 1311)  diphenhydrAMINE (BENADRYL) injection 12.5 mg (12.5 mg Intravenous Given 03/07/23 1310)    ED Course/ Medical Decision Making/ A&P                                 Medical Decision Making Amount and/or Complexity of Data Reviewed Labs: ordered. Radiology: ordered.  Risk Prescription drug management.   Sandra Mathis is here with headache, speech issues.  History of POTS, lupus, rheumatoid arthritis.  Patient with unremarkable vitals.  No fever.  Symptoms started around 10:00 last night with a headache then developed some difficulty with speech and thinks that maybe she had an aphasia.  Last about 30 minutes and resolved.  Took some Excedrin and went to bed.  Headache, remained this morning.  Gradual onset.  Did not happen suddenly and not the worst headache of her life.  Used to have migraines in the past.  She has noticed a little bit of dizziness maybe some double vision this morning continues to have some speech issues at times.  Overall visual fields appear to be intact in the right eye she may be seeing double but not on the left eye.  She maybe has some trace weakness in the right lower extremity cannot really tell me when that started.  Has some sensation decreased in the right lower leg as well but otherwise normal strength and  sensation throughout.  Cranial nerves are grossly intact.  Overall I talked with Dr. Amada Jupiter with neurology.  I got a CT of the head per his review showed no acute process.  No head bleed.  Overall he did recommend sending her for an MRA and MRI of the brain.  Suspicion likely for complex migraine but will rule out stroke.  She does have contrast allergy and will avoid CTA and just get MRI MRA.  She is given headache cocktail.  Basic labs are unremarkable.  COVID and flu test negative.  She was having maybe some cough symptoms but main focus is on migraine/stroke.  Radiology report from CT is pending at time of transfer to [facility] however neurology and myself both do not see any evidence of a head bleed.  Patient to get MRI imaging and if unremarkable can likely follow-up outpatient.  Patient transferred by EMS.  Dr. Anitra Lauth and Dr. Rush Landmark and ED are made aware.  This chart was dictated using voice recognition software.  Despite best efforts to proofread,  errors can occur which can change the documentation meaning.         Final Clinical Impression(s) / ED Diagnoses Final diagnoses:  Other migraine without status migrainosus, not intractable  Stroke-like symptoms    Rx / DC Orders ED Discharge Orders     None         Virgina Norfolk, DO 03/07/23 1351

## 2023-03-07 NOTE — Discharge Instructions (Addendum)
Please follow-up with neurology.  Return if develop fevers, chills, worsening symptoms, facial droop, difficulty speaking, unilateral weakness, seizure or he develop any new or worsening symptoms that are concerning to you may continue to take over-the-counter Tylenol alternating with Motrin for your headache.  We are also prescribing you a Fioricet that you may take to help with your headache.

## 2023-03-07 NOTE — ED Provider Notes (Signed)
  Physical Exam  BP 124/70   Pulse 67   Temp 98.1 F (36.7 C) (Oral)   Resp 18   Wt 104.3 kg   SpO2 100%   BMI 38.27 kg/m   Physical Exam  Procedures  Procedures  ED Course / MDM   Clinical Course as of 03/07/23 1615  Tue Mar 07, 2023  1612 Signout; transfer for MRI; suspected complicated migraine r/o intracranial pathology.  [TY]    Clinical Course User Index [TY] Coral Spikes, DO   Medical Decision Making Amount and/or Complexity of Data Reviewed Labs: ordered. Radiology: ordered.  Risk Prescription drug management.   Received in transfer from med [facility].  Pending MRI.  Had headache with some potential neurodeficits.  Thought to be likely complicated migraine.  Head CT reassuring.  Will get MRI.       Benjiman Core, MD 03/07/23 [phone]

## 2023-03-07 NOTE — ED Notes (Signed)
Spoke with Maisie Fus at [facility] regarding transfer to [facility] ED for MRI

## 2023-03-07 NOTE — ED Triage Notes (Addendum)
Headache 1030 last night , vision change.  Chest congestion and cough all started last night .  Sore throat  from coughing , no fever . Nausea not emesis .  Adds had aphasia last night at 1030 pm , resolved a few minutes later .

## 2023-03-08 ENCOUNTER — Other Ambulatory Visit (HOSPITAL_BASED_OUTPATIENT_CLINIC_OR_DEPARTMENT_OTHER): Payer: Self-pay

## 2023-03-09 ENCOUNTER — Encounter: Payer: Self-pay | Admitting: Neurology

## 2023-03-17 ENCOUNTER — Other Ambulatory Visit (HOSPITAL_BASED_OUTPATIENT_CLINIC_OR_DEPARTMENT_OTHER): Payer: Self-pay

## 2023-03-17 ENCOUNTER — Encounter: Payer: Self-pay | Admitting: Neurology

## 2023-03-31 ENCOUNTER — Ambulatory Visit: Payer: Commercial Managed Care - PPO | Admitting: Podiatry

## 2023-03-31 ENCOUNTER — Encounter: Payer: Self-pay | Admitting: Podiatry

## 2023-03-31 DIAGNOSIS — L600 Ingrowing nail: Secondary | ICD-10-CM | POA: Diagnosis not present

## 2023-03-31 DIAGNOSIS — L603 Nail dystrophy: Secondary | ICD-10-CM

## 2023-03-31 DIAGNOSIS — M0579 Rheumatoid arthritis with rheumatoid factor of multiple sites without organ or systems involvement: Secondary | ICD-10-CM | POA: Diagnosis not present

## 2023-03-31 DIAGNOSIS — Z0000 Encounter for general adult medical examination without abnormal findings: Secondary | ICD-10-CM | POA: Diagnosis not present

## 2023-03-31 NOTE — Progress Notes (Signed)
Initial neurology clinic note  Sandra Mathis MRN: [id number] DOB: Nov 02, 1975  Referring provider: Coral Spikes, DO  Primary care provider: Clearnce Hasten, PA-C  Reason for consult:  Headaches  Subjective:  This is Ms. Sandra Mathis, a 47 y.o. right-handed female with a medical history of migraines, lupus, asthma, autonomic dysfunction (followed by cardiology), current smoker, depression, and anxiety who presents to neurology clinic with headaches. The patient is alone today.  Patient first had migraines in her 22s with rebound headaches. She would have a mild headache, then stabbing pain at top of head, then she would pass out. She would have a headache 2-3 times per week. They could last 3-4 days. She had associated photophobia, phonophobia, and nausea and vomiting. She mentioned that she had a skull fracture as in infant. She thinks she was put on midodrine and gabapentin. That helped for a year, then she stopped having headaches and stop taking medication. Patient was having maybe one mild headache every other week that advil migraine would resolve in 30 minutes.   She then had a severe headache on 03/06/23 associated with right sided weakness, intermittent aphasia, blurry vision. This headache was different from prior headaches. This headache was the whole head from the base of the skull and was most severe in the midline. It felt like someone hit her with a sledge hammer. It was "11/10". She had photophobia and phonophobia without nausea and vomiting. She went to the ED on 03/07/23. CT head, MRI brain, and MRA head and neck were all normal. She was given some headache medications (just fioricet?). This did help. Her aphasia resolved in the ED. Her blurry vision and right sided weakness lasted 3 days. She took Fioricet for 3 days, then things got milder, then increased again. Fioricet was started again. She last took Fioricet about 1.5 weeks ago. She takes medications at headache  onset. She will start with advil migraine and if headache is not gone in 1 hour, she will take Fioricet. She will take advil migraine 2 out of 3 work shifts (2 times per week). She does not have to take it when she is not working.  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 She denies pain, but has a lot of neck tightness and cracking of neck when turning her head. When she gets a headache, laying down in a cold dark room, eliminating as much stimuli as possible is what she wants to do.  She was recently prescribed a prednisone dose pack for potential Lupus flair. She is to pick this up tomorrow and start it.  She is on Lexapro for depression. She has no history of kidney stones.  She drinks 1 cup of coffee and 1 soda per day. She does not drink energy drinks. She is a current smoker (1 pack per week) and trying to quit. She is not on birth control (has "tubes tied").  Her daughter and paternal grandmother also gets migraines.  MEDICATIONS:  Outpatient Encounter Medications as of 04/12/2023  Medication Sig Note   albuterol (PROVENTIL) (2.5 MG/3ML) 0.083% nebulizer solution Take 2.5 mg by nebulization every 6 (six) hours as needed.    albuterol (VENTOLIN HFA) 108 (90 Base) MCG/ACT inhaler Inhale 2 puffs into the lungs every 6 (six) hours as needed for wheezing or shortness of breath.    aspirin 81 MG chewable tablet Chew 81 mg by mouth daily.  benzonatate (TESSALON) 100 MG capsule Take 100 mg by mouth 3 (three) times daily as needed for cough.    butalbital-acetaminophen-caffeine (FIORICET) 50-325-40 MG tablet Take 1 tablet by mouth every 8 (eight) hours as needed for headache.    butalbital-acetaminophen-caffeine (FIORICET) 50-325-40 MG tablet Take 1 tablet by mouth every 6 (six) hours as needed for headaches    cetirizine (ZYRTEC) 10 MG tablet Take 10 mg by mouth daily.    diphenhydrAMINE (BENADRYL) 25 mg capsule Take 25 mg by  mouth every 6 (six) hours as needed.    escitalopram (LEXAPRO) 10 MG tablet Take 10 mg by mouth daily. 03/07/2023: Patient in need of a refill.   escitalopram (LEXAPRO) 10 MG tablet Take 1 tablet (10 mg total) by mouth daily.    famotidine (PEPCID) 20 MG tablet Take 20 mg by mouth 2 (two) times daily.    gabapentin (NEURONTIN) 100 MG capsule Take 1 capsule (100 mg total) by mouth 3 (three) times daily.    linaclotide (LINZESS) 72 MCG capsule Take 1 capsule (72 mcg total) by mouth daily.    meloxicam (MOBIC) 15 MG tablet Take 15 mg by mouth daily.    methocarbamol (ROBAXIN) 500 MG tablet Take 500 mg by mouth every 8 (eight) hours as needed for muscle spasms.    midodrine (PROAMATINE) 2.5 MG tablet Take 2.5 mg by mouth in the morning and at bedtime.    Phenylephrine-DM-GG-APAP (TYLENOL COLD/FLU SEVERE PO) Take 10 mLs by mouth daily as needed (cough / congestion).    predniSONE (DELTASONE) 10 MG tablet Take 6 tablets (60 mg total) by mouth daily for 2 days, THEN 5 tablets (50 mg total) daily for 2 days, THEN 4 tablets (40 mg total) daily for 2 days, THEN 3 tablets (30 mg total) daily for 2 days, THEN 2 tablets (20 mg total) daily for 4 days.    RABEprazole (ACIPHEX) 20 MG tablet Take 20 mg by mouth daily.    traZODone (DESYREL) 50 MG tablet Take 50 mg by mouth at bedtime as needed for sleep.    [DISCONTINUED] diphenhydrAMINE (BENADRYL) 50 MG capsule Take 50 mg by mouth daily.    [DISCONTINUED] ibuprofen (ADVIL) 200 MG tablet Take 400 mg by mouth 2 (two) times daily as needed for fever or headache.    No facility-administered encounter medications on file as of 04/12/2023.    PAST MEDICAL HISTORY: Past Medical History:  Diagnosis Date   Arthritis    Asthma    Low blood pressure    Lupus    POTS (postural orthostatic tachycardia syndrome)    Rheumatoid arthritis (HCC)     PAST SURGICAL HISTORY: History reviewed. No pertinent surgical history.  ALLERGIES: Allergies  Allergen Reactions    Iodine Hives and Shortness Of Breath   Latex Shortness Of Breath and Rash   Lithium Shortness Of Breath and Rash   Morphine And Codeine Other (See Comments)    Cardiac arrest   Oxycontin [Oxycodone] Shortness Of Breath   Penicillins Other (See Comments)    Respiratory depression   Percocet [Oxycodone-Acetaminophen] Shortness Of Breath   Pineapple Flavor Shortness Of Breath   Shellfish Allergy Shortness Of Breath and Rash   Sulfa Antibiotics Shortness Of Breath   Tramadol Shortness Of Breath and Rash   Adhesive [Tape]    Celebrex [Celecoxib] Other (See Comments)    GI distress   Codeine    Doxycycline    Doxycycline Diarrhea   Iodine    Lithium Other (See Comments)  Lyrica [Pregabalin] Other (See Comments)    Severe gastric distress    Morphine And Codeine Other (See Comments)    Cardiac arrest     Penicillins Other (See Comments)    Respiratory distress    Percocet [Oxycodone-Acetaminophen]    Singulair [Montelukast Sodium]    Singulair [Montelukast]     GI distress   Sulfa Antibiotics    Adhesive [Tape] Rash    FAMILY HISTORY: History reviewed. No pertinent family history.  SOCIAL HISTORY: Social History   Tobacco Use   Smoking status: Every Day    Types: Cigarettes   Smokeless tobacco: Never  Vaping Use   Vaping status: Never Used  Substance Use Topics   Alcohol use: Yes    Comment: social   Drug use: Never   Social History   Social History Narrative   ** Merged History Encounter **    Are you right handed or left handed? right   Are you currently employed ? yes   What is your current occupation? [employer]    Do you live at home alone? no   Who lives with you? Husband    What type of home do you live in: 1 story or 2 story?  1 story 4 steps         Objective:  Vital Signs:  BP 123/78   Pulse 86   Ht 5\' 5"  (1.651 m)   Wt 222 lb 3.2 oz (100.8 kg)   SpO2 98%   BMI 36.98 kg/m   General: No acute distress.  Patient appears well-groomed.    Head:  Normocephalic/atraumatic Eyes:  fundi examined but not visualized Neck: supple, tightness and tenderness of trapezius Heart: regular rate and rhythm Lungs: Clear to auscultation bilaterally. Vascular: No carotid bruits.  Neurological Exam: Mental status: alert and oriented, speech fluent and not dysarthric, language intact.  Cranial nerves: CN I: not tested CN II: pupils equal, round and reactive to light, visual fields intact CN III, IV, VI:  full range of motion, no nystagmus, no ptosis CN V: facial sensation intact. CN VII: upper and lower face symmetric CN VIII: hearing intact CN IX, X: uvula midline CN XI: sternocleidomastoid and trapezius muscles intact CN XII: tongue midline  Bulk & Tone: normal, no fasciculations. Motor:  muscle strength 5/5 throughout Deep Tendon Reflexes:  2+ throughout.   Sensation:  Light touch sensation intact. Finger to nose testing:  Without dysmetria.   Gait:  Antalgic gait (right knee pain)   Labs and Imaging review: Internal labs: Lab Results  Component Value Date   HGBA1C 5.3 07/23/2019   03/07/23: CMP unremarkable CBC w/ diff unremarkable  External labs: 04/06/23: TSH wnl HbA1c: 5.7  Mg (06/29/22): 2.1 TSH (08/17/21): 0.76 HbA1c (08/07/20): 5.7 RF (10/17/19): positive (titer 320) CCP (10/17/19): wnl  Imaging: CT head wo contrast (03/07/23): FINDINGS: Brain: No acute intracranial hemorrhage. No midline shift or mass effect. Gray-white differentiation maintained. Unremarkable appearance of the ventricular system.   Vascular: Unremarkable.   Skull: No acute fracture.  No aggressive bone lesion identified.   Sinuses/Orbits: Unremarkable appearance of the orbits. Mastoid air cells clear. No middle ear effusion. No significant sinus disease.   Other: None   IMPRESSION: Negative for acute intracranial abnormality  MRI brain and MRA head and neck (03/07/23): FINDINGS: MRI HEAD FINDINGS   Brain: No acute infarct,  mass effect or extra-axial collection. No chronic microhemorrhage or siderosis. Normal white matter signal, parenchymal volume and CSF spaces. The midline structures are normal.  There is no abnormal contrast enhancement.   Vascular: Normal flow voids.   Skull and upper cervical spine: Normal marrow signal.   Sinuses/Orbits: Negative.   Other: None.   MRA HEAD FINDINGS   POSTERIOR CIRCULATION:   --Vertebral arteries: Normal   --Inferior cerebellar arteries: Normal.   --Basilar artery: Normal.   --Superior cerebellar arteries: Normal.   --Posterior cerebral arteries: Normal.   ANTERIOR CIRCULATION:   --Intracranial internal carotid arteries: Normal.   --Anterior cerebral arteries (ACA): Normal.   --Middle cerebral arteries (MCA): Normal.   MRA NECK FINDINGS   Normal carotid and vertebral arteries. Normal 3 vessel branch pattern of the aortic arch. No stenosis or other abnormality.   IMPRESSION: 1. Normal MRI of the brain. 2. Normal MRA of the head and neck.  Assessment/Plan:  Sandra Mathis is a 47 y.o. female who presents for evaluation of headaches. She has a relevant medical history of migraines, lupus, asthma, autonomic dysfunction (followed by cardiology), current smoker, depression, and anxiety. Her neurological examination is essentially normal except antalgic gait due to knee pain. Available diagnostic data is significant for normal MRI brain and MRA head and neck. Patient's headaches sound most consistent with migraine without aura. She has about 2 headaches per week (8-10 per month) currently. She is currently taking Fioricet for rescue, which often causes rebound/medication overuse headaches as well. I will stop this and start treatment plan below. She also has stiffness of her neck and notices turning her neck in certain ways can trigger headache, so cervicalgia is also likely contributing.  PLAN: Migraine prevention:  Start Topamax 25 mg at bedtime   Migraine rescue:  Start Sumatriptan 100 mg as needed at headache onset, may repeat at 2 hours if needed. Stop Fioricet. Encouraged use of prednisone burst that was prescribed for Lupus flare as it may reset current headache cycle too Limit use of pain relievers to no more than 2 days out of week to prevent risk of rebound or medication-overuse headache. Keep headache diary Physical therapy for cervicalgia   -Return to clinic in 3 months  The impression above as well as the plan as outlined below were extensively discussed with the patient who voiced understanding. All questions were answered to their satisfaction.  When available, results of the above investigations and possible further recommendations will be communicated to the patient via telephone/MyChart. Patient to call office if not contacted after expected testing turnaround time.   Thank you for allowing me to participate in patient's care.  If I can answer any additional questions, I would be pleased to do so.  Jacquelyne Balint, MD   CC: Clearnce Hasten, PA-C [address] [place] [address]  CC: Referring provider: Coral Spikes, DO [address] [place],  [place] [address]

## 2023-03-31 NOTE — Patient Instructions (Signed)

## 2023-03-31 NOTE — Progress Notes (Signed)
Subjective:  Patient ID: Sandra Mathis, female    DOB: 07-19-1975,   MRN: [id number]  Chief Complaint  Patient presents with   Nail Problem    Pt presents with pain in b/l great toes states the one the left has been hurting more it was taken off but it came back.    47 y.o. female presents for concern of bilateral great toenails. Relates the left has had ingrown procedure done before after an injury to the toe. Relates the nail stopped growing and started getting thicker and painful when pressure applied. Also relates irritation now on the right great toe lateral border. States she has tried trimming and feels better for a bit but then hurts again. Would like to have permanently removed.  . Denies any other pedal complaints. Denies n/v/f/c.   Past Medical History:  Diagnosis Date   Arthritis    Asthma    Low blood pressure    Lupus    POTS (postural orthostatic tachycardia syndrome)    Rheumatoid arthritis (HCC)     Objective:  Physical Exam: Vascular: DP/PT pulses 2/4 bilateral. CFT <3 seconds. Normal hair growth on digits. No edema.  Skin. No lacerations or abrasions bilateral feet. Incurvation lateral border right great toenail with tenderness to palpation. No erythema edema or purulence noted. Left great toenail with mild thickness and discoloration distally.  Musculoskeletal: MMT 5/5 bilateral lower extremities in DF, PF, Inversion and Eversion. Deceased ROM in DF of ankle joint.  Neurological: Sensation intact to light touch.   Assessment:   1. Ingrown right greater toenail   2. Onychodystrophy      Plan:  Patient was evaluated and treated and all questions answered. Discussed ingrown toenails etiology and treatment options including procedure for removal vs conservative care.  Patient requesting removal of ingrown nail today. Procedure below.  Discussed procedure and post procedure care and patient expressed understanding.  Debrided left hallux nail back to  patient comfort as courtesy.  Will follow-up in 2 weeks for nail check or sooner if any problems arise.    Procedure:  Procedure: partial Nail Avulsion of right hallux lateral nail border.  Surgeon: Louann Sjogren, DPM  Pre-op Dx: Ingrown toenail without infection Post-op: Same  Place of Surgery: Office exam room.  Indications for surgery: Painful and ingrown toenail.    The patient is requesting removal of nail with  chemical matrixectomy. Risks and complications were discussed with the patient for which they understand and written consent was obtained. Under sterile conditions a total of 3 mL of  1% lidocaine plain was infiltrated in a hallux block fashion. Once anesthetized, the skin was prepped in sterile fashion. A tourniquet was then applied. Next the lateral aspect of hallux nail border was then sharply excised making sure to remove the entire offending nail border.  Next phenol was then applied under standard conditions to permanently destroy the matrix and copiously irrigated. Silvadene was applied. A dry sterile dressing was applied. After application of the dressing the tourniquet was removed and there is found to be an immediate capillary refill time to the digit. The patient tolerated the procedure well without any complications. Post procedure instructions were discussed the patient for which he verbally understood. Follow-up in two weeks for nail check or sooner if any problems are to arise. Discussed signs/symptoms of infection and directed to call the office immediately should any occur or go directly to the emergency room. In the meantime, encouraged to call the office with any questions, concerns,  changes symptoms.   Louann Sjogren, DPM

## 2023-04-05 ENCOUNTER — Other Ambulatory Visit (HOSPITAL_COMMUNITY): Payer: Self-pay

## 2023-04-05 ENCOUNTER — Other Ambulatory Visit: Payer: Self-pay

## 2023-04-05 MED ORDER — GABAPENTIN 100 MG PO CAPS
100.0000 mg | ORAL_CAPSULE | Freq: Three times a day (TID) | ORAL | 0 refills | Status: DC
  Filled 2023-04-05 (×2): qty 90, 30d supply, fill #0

## 2023-04-06 ENCOUNTER — Other Ambulatory Visit (HOSPITAL_COMMUNITY): Payer: Self-pay

## 2023-04-06 DIAGNOSIS — R1030 Lower abdominal pain, unspecified: Secondary | ICD-10-CM | POA: Diagnosis not present

## 2023-04-06 DIAGNOSIS — F411 Generalized anxiety disorder: Secondary | ICD-10-CM | POA: Diagnosis not present

## 2023-04-06 DIAGNOSIS — Z0000 Encounter for general adult medical examination without abnormal findings: Secondary | ICD-10-CM | POA: Diagnosis not present

## 2023-04-06 DIAGNOSIS — M2550 Pain in unspecified joint: Secondary | ICD-10-CM | POA: Diagnosis not present

## 2023-04-06 DIAGNOSIS — R1310 Dysphagia, unspecified: Secondary | ICD-10-CM | POA: Diagnosis not present

## 2023-04-06 DIAGNOSIS — Z720 Tobacco use: Secondary | ICD-10-CM | POA: Diagnosis not present

## 2023-04-06 DIAGNOSIS — G90A Postural orthostatic tachycardia syndrome (POTS): Secondary | ICD-10-CM | POA: Diagnosis not present

## 2023-04-06 DIAGNOSIS — K219 Gastro-esophageal reflux disease without esophagitis: Secondary | ICD-10-CM | POA: Diagnosis not present

## 2023-04-06 DIAGNOSIS — F324 Major depressive disorder, single episode, in partial remission: Secondary | ICD-10-CM | POA: Diagnosis not present

## 2023-04-06 DIAGNOSIS — K5904 Chronic idiopathic constipation: Secondary | ICD-10-CM | POA: Diagnosis not present

## 2023-04-06 DIAGNOSIS — R768 Other specified abnormal immunological findings in serum: Secondary | ICD-10-CM | POA: Diagnosis not present

## 2023-04-06 MED ORDER — LINZESS 72 MCG PO CAPS
72.0000 ug | ORAL_CAPSULE | Freq: Every day | ORAL | 11 refills | Status: DC
  Filled 2023-04-06: qty 30, 30d supply, fill #0
  Filled 2023-05-31: qty 30, 30d supply, fill #1

## 2023-04-06 MED ORDER — ESCITALOPRAM OXALATE 10 MG PO TABS
10.0000 mg | ORAL_TABLET | Freq: Every day | ORAL | 3 refills | Status: AC
  Filled 2023-04-06: qty 90, 90d supply, fill #0
  Filled 2023-07-20: qty 90, 90d supply, fill #1
  Filled 2023-10-19: qty 90, 90d supply, fill #2
  Filled 2024-03-27: qty 90, 90d supply, fill #3

## 2023-04-06 MED ORDER — PREDNISONE 10 MG PO TABS
ORAL_TABLET | ORAL | 0 refills | Status: DC
Start: 2023-04-06 — End: 2023-06-13
  Filled 2023-04-06: qty 44, 12d supply, fill #0

## 2023-04-06 MED ORDER — BUTALBITAL-APAP-CAFFEINE 50-325-40 MG PO TABS
1.0000 | ORAL_TABLET | Freq: Four times a day (QID) | ORAL | 0 refills | Status: DC | PRN
  Filled 2023-04-06: qty 30, 8d supply, fill #0

## 2023-04-12 ENCOUNTER — Ambulatory Visit: Payer: Commercial Managed Care - PPO | Admitting: Neurology

## 2023-04-12 ENCOUNTER — Encounter: Payer: Self-pay | Admitting: Neurology

## 2023-04-12 ENCOUNTER — Other Ambulatory Visit (HOSPITAL_COMMUNITY): Payer: Self-pay

## 2023-04-12 VITALS — BP 123/78 | HR 86 | Ht 65.0 in | Wt 222.2 lb

## 2023-04-12 DIAGNOSIS — M542 Cervicalgia: Secondary | ICD-10-CM

## 2023-04-12 DIAGNOSIS — G43009 Migraine without aura, not intractable, without status migrainosus: Secondary | ICD-10-CM | POA: Diagnosis not present

## 2023-04-12 DIAGNOSIS — R29818 Other symptoms and signs involving the nervous system: Secondary | ICD-10-CM

## 2023-04-12 DIAGNOSIS — H538 Other visual disturbances: Secondary | ICD-10-CM | POA: Diagnosis not present

## 2023-04-12 MED ORDER — SUMATRIPTAN SUCCINATE 100 MG PO TABS
100.0000 mg | ORAL_TABLET | Freq: Once | ORAL | 5 refills | Status: AC | PRN
Start: 2023-04-12 — End: ?
  Filled 2023-04-12: qty 9, 18d supply, fill #0
  Filled 2023-05-06: qty 9, 18d supply, fill #1
  Filled 2023-05-31: qty 9, 18d supply, fill #2
  Filled 2023-09-29: qty 9, 18d supply, fill #3
  Filled 2024-03-27: qty 9, 18d supply, fill #4

## 2023-04-12 MED ORDER — TOPIRAMATE 25 MG PO TABS
25.0000 mg | ORAL_TABLET | Freq: Every day | ORAL | 5 refills | Status: DC
Start: 2023-04-12 — End: 2023-04-24
  Filled 2023-04-12: qty 30, 30d supply, fill #0

## 2023-04-12 NOTE — Patient Instructions (Addendum)
I saw you today for headaches. Your headaches sound most consistent with migraines.  To help your symptoms, we will do the following: Migraine prevention:  Start Topamax 25 mg at bedtime  Migraine rescue:  Start Sumatriptan 100 mg as needed at headache onset, may repeat at 2 hours if needed. Stop Fioricet. I encourage you to use prednisone burst that was prescribed for Lupus flare as it may reset current headache cycle too Limit use of pain relievers to no more than 2 days out of week to prevent risk of rebound or medication-overuse headache. Keep headache diary Physical therapy for cervicalgia   -Return to clinic in 3 months. Please let me know if you have any questions or concerns in the meantime.   The physicians and staff at [facility] are committed to providing excellent care. You may receive a survey requesting feedback about your experience at our office. We strive to receive "very good" responses to the survey questions. If you feel that your experience would prevent you from giving the office a "very good " response, please contact our office to try to remedy the situation. We may be reached at [phone]. Thank you for taking the time out of your busy day to complete the survey.  Jacquelyne Balint, MD [facility]  More migraine information: Be aware of common food triggers:  - Caffeine:  coffee, black tea, cola, Mt. Dew  - Chocolate  - Dairy:  aged cheeses (brie, blue, cheddar, gouda, [place], provolone, [place], Swiss, etc), chocolate milk, buttermilk, sour cream, limit eggs and yogurt  - Nuts, peanut butter  - Alcohol  - Cereals/grains:  FRESH breads (fresh bagels, sourdough, doughnuts), yeast productions  - Processed/canned/aged/cured meats (pre-packaged deli meats, hotdogs)  - MSG/glutamate:  soy sauce, flavor enhancer, pickled/preserved/marinated foods  - Sweeteners:  aspartame (Equal, Nutrasweet).  Sugar and Splenda are okay  - Vegetables:  legumes (lima beans,  lentils, snow peas, fava beans, pinto peans, peas, garbanzo beans), sauerkraut, onions, olives, pickles  - Fruit:  avocados, bananas, citrus fruit (orange, lemon, grapefruit), mango  - Other:  Frozen meals, macaroni and cheese Routine exercise Stay adequately hydrated (aim for 64 oz water daily) Keep headache diary Maintain proper stress management Maintain proper sleep hygiene Do not skip meals Consider supplements:  magnesium citrate 400mg  daily, riboflavin 400mg  daily, coenzyme Q10 100mg  three times daily.

## 2023-04-13 ENCOUNTER — Ambulatory Visit: Payer: Commercial Managed Care - PPO | Admitting: Podiatry

## 2023-04-14 ENCOUNTER — Other Ambulatory Visit (HOSPITAL_COMMUNITY): Payer: Self-pay

## 2023-04-22 ENCOUNTER — Encounter: Payer: Self-pay | Admitting: Neurology

## 2023-04-24 ENCOUNTER — Other Ambulatory Visit (HOSPITAL_COMMUNITY): Payer: Self-pay

## 2023-04-24 ENCOUNTER — Other Ambulatory Visit: Payer: Self-pay | Admitting: Neurology

## 2023-04-24 DIAGNOSIS — G43009 Migraine without aura, not intractable, without status migrainosus: Secondary | ICD-10-CM

## 2023-04-24 MED ORDER — TOPIRAMATE 25 MG PO TABS
50.0000 mg | ORAL_TABLET | Freq: Every day | ORAL | 5 refills | Status: DC
Start: 2023-04-24 — End: 2023-11-29
  Filled 2023-04-24 – 2023-05-06 (×3): qty 60, 30d supply, fill #0
  Filled 2023-05-31: qty 60, 30d supply, fill #1
  Filled 2023-07-20: qty 60, 30d supply, fill #2
  Filled 2023-09-04: qty 60, 30d supply, fill #3
  Filled 2023-09-29: qty 60, 30d supply, fill #4
  Filled 2023-10-31: qty 60, 30d supply, fill #5

## 2023-04-25 ENCOUNTER — Other Ambulatory Visit: Payer: Self-pay | Admitting: Obstetrics & Gynecology

## 2023-04-25 DIAGNOSIS — Z1231 Encounter for screening mammogram for malignant neoplasm of breast: Secondary | ICD-10-CM

## 2023-05-02 ENCOUNTER — Other Ambulatory Visit (HOSPITAL_COMMUNITY): Payer: Self-pay

## 2023-05-02 ENCOUNTER — Other Ambulatory Visit: Payer: Self-pay

## 2023-05-03 ENCOUNTER — Ambulatory Visit: Payer: Commercial Managed Care - PPO

## 2023-05-03 ENCOUNTER — Other Ambulatory Visit (HOSPITAL_COMMUNITY): Payer: Self-pay

## 2023-05-03 DIAGNOSIS — Z1231 Encounter for screening mammogram for malignant neoplasm of breast: Secondary | ICD-10-CM

## 2023-05-06 ENCOUNTER — Other Ambulatory Visit (HOSPITAL_COMMUNITY): Payer: Self-pay

## 2023-05-09 ENCOUNTER — Ambulatory Visit: Payer: Commercial Managed Care - PPO

## 2023-05-17 ENCOUNTER — Other Ambulatory Visit (HOSPITAL_COMMUNITY): Payer: Self-pay

## 2023-05-17 ENCOUNTER — Other Ambulatory Visit: Payer: Self-pay

## 2023-05-17 MED ORDER — GABAPENTIN 100 MG PO CAPS
100.0000 mg | ORAL_CAPSULE | Freq: Three times a day (TID) | ORAL | 2 refills | Status: DC
  Filled 2023-05-17: qty 90, 30d supply, fill #0
  Filled 2023-07-20: qty 90, 30d supply, fill #1
  Filled 2023-10-19: qty 90, 30d supply, fill #2

## 2023-05-17 MED ORDER — MELOXICAM 15 MG PO TABS
15.0000 mg | ORAL_TABLET | Freq: Every day | ORAL | 2 refills | Status: DC
  Filled 2023-05-17: qty 90, 90d supply, fill #0
  Filled 2023-07-20 – 2023-07-21 (×2): qty 90, 90d supply, fill #1

## 2023-05-23 ENCOUNTER — Ambulatory Visit: Payer: Commercial Managed Care - PPO

## 2023-05-23 DIAGNOSIS — K2100 Gastro-esophageal reflux disease with esophagitis, without bleeding: Secondary | ICD-10-CM | POA: Diagnosis not present

## 2023-05-23 DIAGNOSIS — R1319 Other dysphagia: Secondary | ICD-10-CM | POA: Diagnosis not present

## 2023-05-31 ENCOUNTER — Other Ambulatory Visit (HOSPITAL_COMMUNITY): Payer: Self-pay

## 2023-06-03 ENCOUNTER — Ambulatory Visit
Admission: EM | Admit: 2023-06-03 | Discharge: 2023-06-03 | Disposition: A | Discharge status: Home / Self Care | Payer: Commercial Managed Care - PPO | Attending: Internal Medicine | Admitting: Internal Medicine

## 2023-06-03 DIAGNOSIS — J209 Acute bronchitis, unspecified: Secondary | ICD-10-CM

## 2023-06-03 DIAGNOSIS — J4521 Mild intermittent asthma with (acute) exacerbation: Secondary | ICD-10-CM

## 2023-06-03 DIAGNOSIS — R062 Wheezing: Secondary | ICD-10-CM

## 2023-06-03 MED ORDER — AZITHROMYCIN 250 MG PO TABS
250.0000 mg | ORAL_TABLET | Freq: Every day | ORAL | 0 refills | Status: DC
Start: 2023-06-03 — End: 2023-11-29

## 2023-06-03 MED ORDER — PREDNISONE 20 MG PO TABS
40.0000 mg | ORAL_TABLET | Freq: Every day | ORAL | 0 refills | Status: AC
Start: 2023-06-03 — End: 2023-06-08

## 2023-06-03 MED ORDER — IPRATROPIUM-ALBUTEROL 0.5-2.5 (3) MG/3ML IN SOLN
3.0000 mL | Freq: Once | RESPIRATORY_TRACT | Status: AC
  Administered 2023-06-03: 3 mL via RESPIRATORY_TRACT

## 2023-06-03 NOTE — ED Provider Notes (Signed)
[facility]    CSN: [id number] Arrival date & time: 06/03/23  1218      History   Chief Complaint No chief complaint on file.   HPI Sandra Mathis is a 47 y.o. female  presents for evaluation of URI symptoms for 7 days. Patient reports associated symptoms of cough, congestion, chest tightness/wheezing with shortness of breath. Denies N/V/D, fevers, sore throat, ear pain, body aches. Patient does have a hx of asthma.  Has been using albuterol inhaler with little to no improvement.  Patient is a previous smoker that quit 2 months ago.   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  Pt has taken [employer], Tylenol cold and flu OTC for symptoms. Pt has no other concerns at this time.   HPI  Past Medical History:  Diagnosis Date   Arthritis    Asthma    Low blood pressure    Lupus    POTS (postural orthostatic tachycardia syndrome)    Rheumatoid arthritis (HCC)     Patient Active Problem List   Diagnosis Date Noted   Lupus 07/14/2020   POTS (postural orthostatic tachycardia syndrome) 07/14/2020   Tobacco use 03/23/2020   History of right salpingo-oophorectomy 03/09/2020   Lipoma of torso 10/29/2019   Rheumatoid arthritis involving multiple sites with positive rheumatoid factor (HCC) 10/17/2019    History reviewed. No pertinent surgical history.  OB History   No obstetric history on file.      Home Medications    Prior to Admission medications   Medication Sig Start Date End Date Taking? Authorizing Provider  azithromycin (ZITHROMAX) 250 MG tablet Take 1 tablet (250 mg total) by mouth daily. Take first 2 tablets together, then 1 every day until finished. 06/03/23  Yes Radford Pax, NP  predniSONE (DELTASONE) 20 MG tablet Take 2 tablets (40 mg total) by mouth daily with breakfast for 5 days. 06/03/23 06/08/23 Yes Radford Pax, NP  albuterol (PROVENTIL) (2.5 MG/3ML) 0.083% nebulizer solution Take 2.5 mg by nebulization every 6 (six) hours as needed.  06/13/19   [provider]  albuterol (VENTOLIN HFA) 108 (90 Base) MCG/ACT inhaler Inhale 2 puffs into the lungs every 6 (six) hours as needed for wheezing or shortness of breath.    [provider]  aspirin 81 MG chewable tablet Chew 81 mg by mouth daily.    [provider]  benzonatate (TESSALON) 100 MG capsule Take 100 mg by mouth 3 (three) times daily as needed for cough. 08/10/21   [provider]  cetirizine (ZYRTEC) 10 MG tablet Take 10 mg by mouth daily.    [provider]  diphenhydrAMINE (BENADRYL) 25 mg capsule Take 25 mg by mouth every 6 (six) hours as needed.    [provider]  escitalopram (LEXAPRO) 10 MG tablet Take 1 tablet (10 mg total) by mouth daily. 04/06/23     famotidine (PEPCID) 20 MG tablet Take 20 mg by mouth 2 (two) times daily. 07/28/21   [provider]  gabapentin (NEURONTIN) 100 MG capsule Take 1 capsule (100 mg total) by mouth 3 (three) times daily. 05/17/23     linaclotide (LINZESS) 72 MCG capsule Take 1 capsule (72 mcg total) by mouth daily. 04/06/23     meloxicam (MOBIC) 15 MG tablet Take 15 mg by mouth daily.    [provider]  meloxicam (MOBIC) 15 MG tablet Take 1 tablet (15 mg total) by mouth daily. 05/17/23     methocarbamol (ROBAXIN) 500 MG tablet Take 500  mg by mouth every 8 (eight) hours as needed for muscle spasms.    [provider]  midodrine (PROAMATINE) 2.5 MG tablet Take 2.5 mg by mouth in the morning and at bedtime. 05/18/20   [provider]  Phenylephrine-DM-GG-APAP (TYLENOL COLD/FLU SEVERE PO) Take 10 mLs by mouth daily as needed (cough / congestion).    [provider]  RABEprazole (ACIPHEX) 20 MG tablet Take 20 mg by mouth daily.    [provider]  SUMAtriptan (IMITREX) 100 MG tablet Take 1 tablet (100 mg total) by mouth once as needed for up to 1 dose for migraine. May repeat in 2 hours if headache persists or recurs. 04/12/23   Antony Madura,  MD  topiramate (TOPAMAX) 25 MG tablet Take 2 tablets (50 mg total) by mouth daily. 04/24/23   Antony Madura, MD  traZODone (DESYREL) 50 MG tablet Take 50 mg by mouth at bedtime as needed for sleep. 04/03/20   [provider]    Family History History reviewed. No pertinent family history.  Social History Social History   Tobacco Use   Smoking status: Every Day    Types: Cigarettes   Smokeless tobacco: Never  Vaping Use   Vaping status: Never Used  Substance Use Topics   Alcohol use: Yes    Comment: social   Drug use: Never     Allergies   Iodine, Latex, Lithium, Morphine and codeine, Oxycontin [oxycodone], Penicillins, Percocet [oxycodone-acetaminophen], Pineapple flavoring agent (non-screening), Shellfish allergy, Sulfa antibiotics, Tramadol, Adhesive [tape], Celebrex [celecoxib], Codeine, Doxycycline, Doxycycline, Iodine, Lithium, Lyrica [pregabalin], Morphine and codeine, Penicillins, Percocet [oxycodone-acetaminophen], Singulair [montelukast sodium], Singulair [montelukast], Sulfa antibiotics, and Adhesive [tape]   Review of Systems Review of Systems  HENT:  Positive for congestion.   Respiratory:  Positive for cough, chest tightness and wheezing.      Physical Exam Triage Vital Signs ED Triage Vitals  Encounter Vitals Group     BP 06/03/23 1432 115/81     Systolic BP Percentile --      Diastolic BP Percentile --      Pulse Rate 06/03/23 1432 69     Resp 06/03/23 1432 18     Temp 06/03/23 1432 97.9 F (36.6 C)     Temp Source 06/03/23 1432 Oral     SpO2 06/03/23 1432 98 %     Weight --      Height --      Head Circumference --      Peak Flow --      Pain Score 06/03/23 1429 0     Pain Loc --      Pain Education --      Exclude from Growth Chart --    No data found.  Updated Vital Signs BP 115/81 (BP Location: Right Arm)   Pulse 69   Temp 97.9 F (36.6 C) (Oral)   Resp 18   SpO2 98%   Visual Acuity Right Eye Distance:   Left Eye  Distance:   Bilateral Distance:    Right Eye Near:   Left Eye Near:    Bilateral Near:     Physical Exam Vitals and nursing note reviewed.  Constitutional:      General: She is not in acute distress.    Appearance: She is well-developed. She is not ill-appearing.  HENT:     Head: Normocephalic and atraumatic.     Right Ear: Tympanic membrane and ear canal normal.     Left Ear: Tympanic membrane  and ear canal normal.     Nose: No congestion.     Mouth/Throat:     Mouth: Mucous membranes are moist.     Pharynx: Oropharynx is clear. Uvula midline. No posterior oropharyngeal erythema.     Tonsils: No tonsillar exudate or tonsillar abscesses.  Eyes:     Conjunctiva/sclera: Conjunctivae normal.     Pupils: Pupils are equal, round, and reactive to light.  Cardiovascular:     Rate and Rhythm: Normal rate and regular rhythm.     Heart sounds: Normal heart sounds.  Pulmonary:     Effort: Pulmonary effort is normal.     Breath sounds: Normal breath sounds.  Musculoskeletal:     Cervical back: Normal range of motion and neck supple.  Lymphadenopathy:     Cervical: No cervical adenopathy.  Skin:    General: Skin is warm and dry.  Neurological:     General: No focal deficit present.     Mental Status: She is alert and oriented to person, place, and time.  Psychiatric:        Mood and Affect: Mood normal.        Behavior: Behavior normal.      UC Treatments / Results  Labs (all labs ordered are listed, but only abnormal results are displayed) Labs Reviewed - No data to display  EKG   Radiology No results found.  Procedures Procedures (including critical care time)  Medications Ordered in UC Medications  ipratropium-albuterol (DUONEB) 0.5-2.5 (3) MG/3ML nebulizer solution 3 mL (3 mLs Nebulization Given 06/03/23 1508)    Initial Impression / Assessment and Plan / UC Course  I have reviewed the triage vital signs and the nursing notes.  Pertinent labs & imaging  results that were available during my care of the patient were reviewed by me and considered in my medical decision making (see chart for details).     Reviewed exam and symptoms with patient.  No red flags.  Patient presenting with 1 week of URI symptoms with asthma exacerbation.  Given DuoNeb in clinic with improvement in her chest tightness.  O2 remains 98% on room air.  Will start Zithromax given length of symptoms with prednisone for asthma flare.  Continue albuterol inhaler as needed. PCP follow-up 2 to 3 days for recheck.  Strict ER precautions reviewed and patient verbalized understanding. Final Clinical Impressions(s) / UC Diagnoses   Final diagnoses:  Wheezing  Mild intermittent asthma with acute exacerbation  Acute bronchitis, unspecified organism     Discharge Instructions      Start Zithromax as prescribed.  Prednisone daily for 5 days.  Continue albuterol inhaler as needed.     ED Prescriptions     Medication Sig Dispense Auth. Provider   predniSONE (DELTASONE) 20 MG tablet Take 2 tablets (40 mg total) by mouth daily with breakfast for 5 days. 10 tablet Radford Pax, NP   azithromycin (ZITHROMAX) 250 MG tablet Take 1 tablet (250 mg total) by mouth daily. Take first 2 tablets together, then 1 every day until finished. 6 tablet Radford Pax, NP      PDMP not reviewed this encounter.   Radford Pax, NP 06/03/23 [phone]

## 2023-06-03 NOTE — ED Triage Notes (Signed)
Pt presents to UC for c/o nonproductive cough x1 week. Pt reports pain in neck and chest tightness starting last night. Tried tessalon perles, tylenol cold and flu, inhaler give no relief.

## 2023-06-03 NOTE — Discharge Instructions (Addendum)
Start Zithromax as prescribed.  Prednisone daily for 5 days.  Continue albuterol inhaler as needed.

## 2023-06-06 DIAGNOSIS — Z87891 Personal history of nicotine dependence: Secondary | ICD-10-CM | POA: Diagnosis not present

## 2023-06-06 DIAGNOSIS — Z20822 Contact with and (suspected) exposure to covid-19: Secondary | ICD-10-CM | POA: Diagnosis not present

## 2023-06-06 DIAGNOSIS — J40 Bronchitis, not specified as acute or chronic: Secondary | ICD-10-CM | POA: Diagnosis not present

## 2023-06-13 ENCOUNTER — Other Ambulatory Visit: Payer: Self-pay

## 2023-06-13 ENCOUNTER — Ambulatory Visit: Payer: Commercial Managed Care - PPO

## 2023-06-13 ENCOUNTER — Other Ambulatory Visit (HOSPITAL_COMMUNITY): Payer: Self-pay

## 2023-06-13 DIAGNOSIS — R053 Chronic cough: Secondary | ICD-10-CM | POA: Diagnosis not present

## 2023-06-13 DIAGNOSIS — J45909 Unspecified asthma, uncomplicated: Secondary | ICD-10-CM | POA: Diagnosis not present

## 2023-06-13 DIAGNOSIS — E669 Obesity, unspecified: Secondary | ICD-10-CM | POA: Diagnosis not present

## 2023-06-13 MED ORDER — PREDNISONE 10 MG PO TABS
ORAL_TABLET | ORAL | 0 refills | Status: AC
  Filled 2023-06-13: qty 18, 9d supply, fill #0

## 2023-06-14 ENCOUNTER — Other Ambulatory Visit (HOSPITAL_COMMUNITY): Payer: Self-pay

## 2023-06-14 ENCOUNTER — Other Ambulatory Visit: Payer: Self-pay

## 2023-06-21 DIAGNOSIS — K449 Diaphragmatic hernia without obstruction or gangrene: Secondary | ICD-10-CM | POA: Diagnosis not present

## 2023-06-21 DIAGNOSIS — R053 Chronic cough: Secondary | ICD-10-CM | POA: Diagnosis not present

## 2023-07-05 ENCOUNTER — Other Ambulatory Visit: Payer: Self-pay | Admitting: Medical Genetics

## 2023-07-08 ENCOUNTER — Other Ambulatory Visit (HOSPITAL_BASED_OUTPATIENT_CLINIC_OR_DEPARTMENT_OTHER): Payer: Self-pay

## 2023-07-19 ENCOUNTER — Other Ambulatory Visit (HOSPITAL_COMMUNITY): Payer: Self-pay

## 2023-07-20 ENCOUNTER — Other Ambulatory Visit (HOSPITAL_COMMUNITY): Payer: Self-pay

## 2023-07-20 ENCOUNTER — Other Ambulatory Visit: Payer: Self-pay

## 2023-07-21 ENCOUNTER — Other Ambulatory Visit (HOSPITAL_COMMUNITY): Payer: Self-pay

## 2023-07-21 ENCOUNTER — Encounter (HOSPITAL_COMMUNITY): Payer: Self-pay

## 2023-07-23 DIAGNOSIS — R5383 Other fatigue: Secondary | ICD-10-CM | POA: Diagnosis not present

## 2023-07-25 DIAGNOSIS — F419 Anxiety disorder, unspecified: Secondary | ICD-10-CM | POA: Diagnosis not present

## 2023-07-25 DIAGNOSIS — K5904 Chronic idiopathic constipation: Secondary | ICD-10-CM | POA: Diagnosis not present

## 2023-07-25 DIAGNOSIS — F339 Major depressive disorder, recurrent, unspecified: Secondary | ICD-10-CM | POA: Diagnosis not present

## 2023-07-25 DIAGNOSIS — M2550 Pain in unspecified joint: Secondary | ICD-10-CM | POA: Diagnosis not present

## 2023-07-27 ENCOUNTER — Other Ambulatory Visit: Payer: Self-pay

## 2023-07-27 ENCOUNTER — Other Ambulatory Visit (HOSPITAL_COMMUNITY): Payer: Self-pay

## 2023-07-27 MED ORDER — MIDODRINE HCL 2.5 MG PO TABS
2.5000 mg | ORAL_TABLET | Freq: Every day | ORAL | 3 refills | Status: AC
Start: 2023-07-27 — End: ?
  Filled 2023-07-27 – 2023-10-19 (×2): qty 90, 90d supply, fill #0
  Filled 2024-04-24: qty 90, 90d supply, fill #1

## 2023-07-27 MED ORDER — MIDODRINE HCL 2.5 MG PO TABS
2.5000 mg | ORAL_TABLET | Freq: Every day | ORAL | 3 refills | Status: DC
  Filled 2023-07-27: qty 90, 90d supply, fill #0

## 2023-07-31 ENCOUNTER — Other Ambulatory Visit (HOSPITAL_COMMUNITY): Payer: Self-pay

## 2023-07-31 DIAGNOSIS — R1319 Other dysphagia: Secondary | ICD-10-CM | POA: Diagnosis not present

## 2023-07-31 DIAGNOSIS — K449 Diaphragmatic hernia without obstruction or gangrene: Secondary | ICD-10-CM | POA: Diagnosis not present

## 2023-07-31 DIAGNOSIS — R1314 Dysphagia, pharyngoesophageal phase: Secondary | ICD-10-CM | POA: Diagnosis not present

## 2023-07-31 DIAGNOSIS — K3189 Other diseases of stomach and duodenum: Secondary | ICD-10-CM | POA: Diagnosis not present

## 2023-07-31 DIAGNOSIS — K2950 Unspecified chronic gastritis without bleeding: Secondary | ICD-10-CM | POA: Diagnosis not present

## 2023-07-31 DIAGNOSIS — K2100 Gastro-esophageal reflux disease with esophagitis, without bleeding: Secondary | ICD-10-CM | POA: Diagnosis not present

## 2023-08-02 ENCOUNTER — Other Ambulatory Visit (HOSPITAL_COMMUNITY)
Admission: RE | Admit: 2023-08-02 | Discharge: 2023-08-02 | Disposition: A | Discharge status: Home / Self Care | Payer: Self-pay | Source: Ambulatory Visit | Attending: Medical Genetics | Admitting: Medical Genetics

## 2023-08-04 DIAGNOSIS — K2100 Gastro-esophageal reflux disease with esophagitis, without bleeding: Secondary | ICD-10-CM | POA: Diagnosis not present

## 2023-08-07 NOTE — Progress Notes (Deleted)
 NEUROLOGY FOLLOW UP OFFICE NOTE  Ki Luckman [id number]  Subjective:  Sandra Mathis is a 48 y.o. year old right-handed female with a medical history of migraines, lupus, asthma, autonomic dysfunction (followed by cardiology), current smoker, depression, and anxiety who we last saw on 04/12/23 for headaches.  To briefly review: 04/12/23: Patient first had migraines in her 95s with rebound headaches. She would have a mild headache, then stabbing pain at top of head, then she would pass out. She would have a headache 2-3 times per week. They could last 3-4 days. She had associated photophobia, phonophobia, and nausea and vomiting. She mentioned that she had a skull fracture as in infant. She thinks she was put on midodrine and gabapentin. That helped for a year, then she stopped having headaches and stop taking medication. Patient was having maybe one mild headache every other week that advil migraine would resolve in 30 minutes.    She then had a severe headache on 03/06/23 associated with right sided weakness, intermittent aphasia, blurry vision. This headache was different from prior headaches. This headache was the whole head from the base of the skull and was most severe in the midline. It felt like someone hit her with a sledge hammer. It was "11/10". She had photophobia and phonophobia without nausea and vomiting. She went to the ED on 03/07/23. CT head, MRI brain, and MRA head and neck were all normal. She was given some headache medications (just fioricet?). This did help. Her aphasia resolved in the ED. Her blurry vision and right sided weakness lasted 3 days. She took Fioricet for 3 days, then things got milder, then increased again. Fioricet was started again. She last took Fioricet about 1.5 weeks ago. She takes medications at headache onset. She will start with advil migraine and if headache is not gone in 1 hour, she will take Fioricet. She will take advil migraine 2 out of 3 work  shifts (2 times per week). She does not have to take it when she is not working.   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 She denies pain, but has a lot of neck tightness and cracking of neck when turning her head. When she gets a headache, laying down in a cold dark room, eliminating as much stimuli as possible is what she wants to do.   She was recently prescribed a prednisone dose pack for potential Lupus flair. She is to pick this up tomorrow and start it.   She is on Lexapro for depression. She has no history of kidney stones.   She drinks 1 cup of coffee and 1 soda per day. She does not drink energy drinks. She is a current smoker (1 pack per week) and trying to quit. She is not on birth control (has "tubes tied").   Her daughter and paternal grandmother also gets migraines.  Most recent Assessment and Plan (04/12/23): Sandra Mathis is a 48 y.o. female who presents for evaluation of headaches. She has a relevant medical history of migraines, lupus, asthma, autonomic dysfunction (followed by cardiology), current smoker, depression, and anxiety. Her neurological examination is essentially normal except antalgic gait due to knee pain. Available diagnostic data is significant for normal MRI brain and MRA head and neck. Patient's headaches sound most consistent with migraine without aura. She has about 2 headaches per week (8-10 per month) currently. She is currently taking Fioricet for rescue, which often  causes rebound/medication overuse headaches as well. I will stop this and start treatment plan below. She also has stiffness of her neck and notices turning her neck in certain ways can trigger headache, so cervicalgia is also likely contributing.   PLAN: Migraine prevention:  Start Topamax 25 mg at bedtime  Migraine rescue:  Start Sumatriptan 100 mg as needed at headache onset, may repeat at 2 hours if needed. Stop  Fioricet. Encouraged use of prednisone burst that was prescribed for Lupus flare as it may reset current headache cycle too Limit use of pain relievers to no more than 2 days out of week to prevent risk of rebound or medication-overuse headache. Keep headache diary Physical therapy for cervicalgia  Since their last visit: Due to ongoing headaches, I increased topamax to 50 mg daily on 04/24/23.  Headaches?***  PT?***  MEDICATIONS:  Outpatient Encounter Medications as of 08/16/2023  Medication Sig   albuterol (PROVENTIL) (2.5 MG/3ML) 0.083% nebulizer solution Take 2.5 mg by nebulization every 6 (six) hours as needed.   albuterol (VENTOLIN HFA) 108 (90 Base) MCG/ACT inhaler Inhale 2 puffs into the lungs every 6 (six) hours as needed for wheezing or shortness of breath.   aspirin 81 MG chewable tablet Chew 81 mg by mouth daily.   azithromycin (ZITHROMAX) 250 MG tablet Take 1 tablet (250 mg total) by mouth daily. Take first 2 tablets together, then 1 every day until finished.   benzonatate (TESSALON) 100 MG capsule Take 100 mg by mouth 3 (three) times daily as needed for cough.   cetirizine (ZYRTEC) 10 MG tablet Take 10 mg by mouth daily.   diphenhydrAMINE (BENADRYL) 25 mg capsule Take 25 mg by mouth every 6 (six) hours as needed.   escitalopram (LEXAPRO) 10 MG tablet Take 1 tablet (10 mg total) by mouth daily.   famotidine (PEPCID) 20 MG tablet Take 20 mg by mouth 2 (two) times daily.   gabapentin (NEURONTIN) 100 MG capsule Take 1 capsule (100 mg total) by mouth 3 (three) times daily.   linaclotide (LINZESS) 72 MCG capsule Take 1 capsule (72 mcg total) by mouth daily.   meloxicam (MOBIC) 15 MG tablet Take 15 mg by mouth daily.   meloxicam (MOBIC) 15 MG tablet Take 1 tablet (15 mg total) by mouth daily.   methocarbamol (ROBAXIN) 500 MG tablet Take 500 mg by mouth every 8 (eight) hours as needed for muscle spasms.   midodrine (PROAMATINE) 2.5 MG tablet Take 2.5 mg by mouth in the morning and  at bedtime.   midodrine (PROAMATINE) 2.5 MG tablet Take 1 tablet (2.5 mg total) by mouth daily.   midodrine (PROAMATINE) 2.5 MG tablet Take 1 tablet (2.5 mg total) by mouth daily.   Phenylephrine-DM-GG-APAP (TYLENOL COLD/FLU SEVERE PO) Take 10 mLs by mouth daily as needed (cough / congestion).   RABEprazole (ACIPHEX) 20 MG tablet Take 20 mg by mouth daily.   SUMAtriptan (IMITREX) 100 MG tablet Take 1 tablet (100 mg total) by mouth once as needed for up to 1 dose for migraine. May repeat in 2 hours if headache persists or recurs.   topiramate (TOPAMAX) 25 MG tablet Take 2 tablets (50 mg total) by mouth daily.   traZODone (DESYREL) 50 MG tablet Take 50 mg by mouth at bedtime as needed for sleep.   No facility-administered encounter medications on file as of 08/16/2023.    PAST MEDICAL HISTORY: Past Medical History:  Diagnosis Date   Arthritis    Asthma    Low blood pressure  Lupus    POTS (postural orthostatic tachycardia syndrome)    Rheumatoid arthritis (HCC)     PAST SURGICAL HISTORY: No past surgical history on file.  ALLERGIES: Allergies  Allergen Reactions   Iodine Hives and Shortness Of Breath   Latex Shortness Of Breath and Rash   Lithium Shortness Of Breath and Rash   Morphine And Codeine Other (See Comments)    Cardiac arrest   Oxycontin [Oxycodone] Shortness Of Breath   Penicillins Other (See Comments)    Respiratory depression   Percocet [Oxycodone-Acetaminophen] Shortness Of Breath   Pineapple Flavoring Agent (Non-Screening) Shortness Of Breath   Shellfish Allergy Shortness Of Breath and Rash   Sulfa Antibiotics Shortness Of Breath   Tramadol Shortness Of Breath and Rash   Adhesive [Tape]    Celebrex [Celecoxib] Other (See Comments)    GI distress   Codeine    Doxycycline    Doxycycline Diarrhea   Iodine    Lithium Other (See Comments)        Lyrica [Pregabalin] Other (See Comments)    Severe gastric distress    Morphine And Codeine Other (See  Comments)    Cardiac arrest     Penicillins Other (See Comments)    Respiratory distress    Percocet [Oxycodone-Acetaminophen]    Singulair [Montelukast Sodium]    Singulair [Montelukast]     GI distress   Sulfa Antibiotics    Adhesive [Tape] Rash    FAMILY HISTORY: No family history on file.  SOCIAL HISTORY: Social History   Tobacco Use   Smoking status: Every Day    Types: Cigarettes   Smokeless tobacco: Never  Vaping Use   Vaping status: Never Used  Substance Use Topics   Alcohol use: Yes    Comment: social   Drug use: Never   Social History   Social History Narrative   ** Merged History Encounter **    Are you right handed or left handed? right   Are you currently employed ? yes   What is your current occupation? [employer]    Do you live at home alone? no   Who lives with you? Husband    What type of home do you live in: 1 story or 2 story?  1 story 4 steps           Objective:  Vital Signs:  There were no vitals taken for this visit.  ***  Labs and Imaging review: New results: External labs: Ferritin (07/23/23): 149  Previously reviewed results: Lab Results  Component Value Date    HGBA1C 5.3 07/23/2019      03/07/23: CMP unremarkable CBC w/ diff unremarkable   External labs: 04/06/23: TSH wnl HbA1c: 5.7   Mg (06/29/22): 2.1 TSH (08/17/21): 0.76 HbA1c (08/07/20): 5.7 RF (10/17/19): positive (titer 320) CCP (10/17/19): wnl   Imaging: CT head wo contrast (03/07/23): FINDINGS: Brain: No acute intracranial hemorrhage. No midline shift or mass effect. Gray-white differentiation maintained. Unremarkable appearance of the ventricular system.   Vascular: Unremarkable.   Skull: No acute fracture.  No aggressive bone lesion identified.   Sinuses/Orbits: Unremarkable appearance of the orbits. Mastoid air cells clear. No middle ear effusion. No significant sinus disease.   Other: None   IMPRESSION: Negative for acute intracranial  abnormality   MRI brain and MRA head and neck (03/07/23): FINDINGS: MRI HEAD FINDINGS   Brain: No acute infarct, mass effect or extra-axial collection. No chronic microhemorrhage or siderosis. Normal white matter signal, parenchymal volume and CSF  spaces. The midline structures are normal. There is no abnormal contrast enhancement.   Vascular: Normal flow voids.   Skull and upper cervical spine: Normal marrow signal.   Sinuses/Orbits: Negative.   Other: None.   MRA HEAD FINDINGS   POSTERIOR CIRCULATION:   --Vertebral arteries: Normal   --Inferior cerebellar arteries: Normal.   --Basilar artery: Normal.   --Superior cerebellar arteries: Normal.   --Posterior cerebral arteries: Normal.   ANTERIOR CIRCULATION:   --Intracranial internal carotid arteries: Normal.   --Anterior cerebral arteries (ACA): Normal.   --Middle cerebral arteries (MCA): Normal.   MRA NECK FINDINGS   Normal carotid and vertebral arteries. Normal 3 vessel branch pattern of the aortic arch. No stenosis or other abnormality.   IMPRESSION: 1. Normal MRI of the brain. 2. Normal MRA of the head and neck.  Assessment/Plan:  This is [employer], a 48 y.o. female with: ***   Plan: ***  Return to clinic in ***  Total time spent reviewing records, interview, history/exam, documentation, and coordination of care on day of encounter:  *** min  Jacquelyne Balint, MD

## 2023-08-15 LAB — GENECONNECT MOLECULAR SCREEN: Genetic Analysis Overall Interpretation: NEGATIVE

## 2023-08-16 ENCOUNTER — Ambulatory Visit: Payer: Commercial Managed Care - PPO | Admitting: Neurology

## 2023-09-04 ENCOUNTER — Other Ambulatory Visit (HOSPITAL_COMMUNITY): Payer: Self-pay

## 2023-09-04 ENCOUNTER — Encounter (HOSPITAL_COMMUNITY): Payer: Self-pay

## 2023-09-04 ENCOUNTER — Other Ambulatory Visit: Payer: Self-pay

## 2023-09-04 MED ORDER — RABEPRAZOLE SODIUM 20 MG PO TBEC
20.0000 mg | DELAYED_RELEASE_TABLET | Freq: Two times a day (BID) | ORAL | 2 refills | Status: DC
Start: 2023-01-30 — End: 2023-12-25
  Filled 2023-09-04: qty 180, 90d supply, fill #0

## 2023-09-06 DIAGNOSIS — K625 Hemorrhage of anus and rectum: Secondary | ICD-10-CM | POA: Diagnosis not present

## 2023-09-06 DIAGNOSIS — B9689 Other specified bacterial agents as the cause of diseases classified elsewhere: Secondary | ICD-10-CM | POA: Diagnosis not present

## 2023-09-06 DIAGNOSIS — Z880 Allergy status to penicillin: Secondary | ICD-10-CM | POA: Diagnosis not present

## 2023-09-06 DIAGNOSIS — K649 Unspecified hemorrhoids: Secondary | ICD-10-CM | POA: Diagnosis not present

## 2023-09-06 DIAGNOSIS — Z87891 Personal history of nicotine dependence: Secondary | ICD-10-CM | POA: Diagnosis not present

## 2023-09-06 DIAGNOSIS — K644 Residual hemorrhoidal skin tags: Secondary | ICD-10-CM | POA: Diagnosis not present

## 2023-09-06 DIAGNOSIS — R300 Dysuria: Secondary | ICD-10-CM | POA: Diagnosis not present

## 2023-09-06 DIAGNOSIS — N760 Acute vaginitis: Secondary | ICD-10-CM | POA: Diagnosis not present

## 2023-09-14 DIAGNOSIS — Z87891 Personal history of nicotine dependence: Secondary | ICD-10-CM | POA: Diagnosis not present

## 2023-09-14 DIAGNOSIS — Z888 Allergy status to other drugs, medicaments and biological substances status: Secondary | ICD-10-CM | POA: Diagnosis not present

## 2023-09-14 DIAGNOSIS — Z881 Allergy status to other antibiotic agents status: Secondary | ICD-10-CM | POA: Diagnosis not present

## 2023-09-14 DIAGNOSIS — R519 Headache, unspecified: Secondary | ICD-10-CM | POA: Diagnosis not present

## 2023-09-14 DIAGNOSIS — Z79899 Other long term (current) drug therapy: Secondary | ICD-10-CM | POA: Diagnosis not present

## 2023-09-14 DIAGNOSIS — Z91040 Latex allergy status: Secondary | ICD-10-CM | POA: Diagnosis not present

## 2023-09-14 DIAGNOSIS — Z880 Allergy status to penicillin: Secondary | ICD-10-CM | POA: Diagnosis not present

## 2023-09-14 DIAGNOSIS — E876 Hypokalemia: Secondary | ICD-10-CM | POA: Diagnosis not present

## 2023-09-14 DIAGNOSIS — M329 Systemic lupus erythematosus, unspecified: Secondary | ICD-10-CM | POA: Diagnosis not present

## 2023-09-14 DIAGNOSIS — Z885 Allergy status to narcotic agent status: Secondary | ICD-10-CM | POA: Diagnosis not present

## 2023-09-14 DIAGNOSIS — F17210 Nicotine dependence, cigarettes, uncomplicated: Secondary | ICD-10-CM | POA: Diagnosis not present

## 2023-09-14 DIAGNOSIS — K219 Gastro-esophageal reflux disease without esophagitis: Secondary | ICD-10-CM | POA: Diagnosis not present

## 2023-09-14 DIAGNOSIS — G43109 Migraine with aura, not intractable, without status migrainosus: Secondary | ICD-10-CM | POA: Diagnosis not present

## 2023-09-20 DIAGNOSIS — R102 Pelvic and perineal pain: Secondary | ICD-10-CM | POA: Diagnosis not present

## 2023-09-20 DIAGNOSIS — N939 Abnormal uterine and vaginal bleeding, unspecified: Secondary | ICD-10-CM | POA: Diagnosis not present

## 2023-09-20 DIAGNOSIS — N930 Postcoital and contact bleeding: Secondary | ICD-10-CM | POA: Diagnosis not present

## 2023-09-20 DIAGNOSIS — Z124 Encounter for screening for malignant neoplasm of cervix: Secondary | ICD-10-CM | POA: Diagnosis not present

## 2023-09-20 DIAGNOSIS — N921 Excessive and frequent menstruation with irregular cycle: Secondary | ICD-10-CM | POA: Diagnosis not present

## 2023-09-20 DIAGNOSIS — Z1151 Encounter for screening for human papillomavirus (HPV): Secondary | ICD-10-CM | POA: Diagnosis not present

## 2023-09-20 DIAGNOSIS — Z9889 Other specified postprocedural states: Secondary | ICD-10-CM | POA: Diagnosis not present

## 2023-09-20 DIAGNOSIS — B3731 Acute candidiasis of vulva and vagina: Secondary | ICD-10-CM | POA: Diagnosis not present

## 2023-09-25 ENCOUNTER — Encounter (HOSPITAL_COMMUNITY): Payer: Self-pay

## 2023-09-25 ENCOUNTER — Other Ambulatory Visit: Payer: Self-pay

## 2023-09-25 ENCOUNTER — Other Ambulatory Visit (HOSPITAL_COMMUNITY): Payer: Self-pay

## 2023-09-25 DIAGNOSIS — G43909 Migraine, unspecified, not intractable, without status migrainosus: Secondary | ICD-10-CM | POA: Diagnosis not present

## 2023-09-25 MED ORDER — PREDNISONE 10 MG PO TABS
ORAL_TABLET | ORAL | 0 refills | Status: DC
  Filled 2023-09-25: qty 21, 6d supply, fill #0

## 2023-09-26 DIAGNOSIS — F39 Unspecified mood [affective] disorder: Secondary | ICD-10-CM | POA: Diagnosis not present

## 2023-09-27 DIAGNOSIS — I959 Hypotension, unspecified: Secondary | ICD-10-CM | POA: Diagnosis not present

## 2023-09-27 DIAGNOSIS — M79603 Pain in arm, unspecified: Secondary | ICD-10-CM | POA: Diagnosis not present

## 2023-09-27 DIAGNOSIS — M25561 Pain in right knee: Secondary | ICD-10-CM | POA: Diagnosis not present

## 2023-09-27 DIAGNOSIS — Z043 Encounter for examination and observation following other accident: Secondary | ICD-10-CM | POA: Diagnosis not present

## 2023-09-27 DIAGNOSIS — K219 Gastro-esophageal reflux disease without esophagitis: Secondary | ICD-10-CM | POA: Diagnosis not present

## 2023-09-27 DIAGNOSIS — T07XXXA Unspecified multiple injuries, initial encounter: Secondary | ICD-10-CM | POA: Diagnosis not present

## 2023-09-27 DIAGNOSIS — Z885 Allergy status to narcotic agent status: Secondary | ICD-10-CM | POA: Diagnosis not present

## 2023-09-27 DIAGNOSIS — M25562 Pain in left knee: Secondary | ICD-10-CM | POA: Diagnosis not present

## 2023-09-27 DIAGNOSIS — Z881 Allergy status to other antibiotic agents status: Secondary | ICD-10-CM | POA: Diagnosis not present

## 2023-09-27 DIAGNOSIS — Z91040 Latex allergy status: Secondary | ICD-10-CM | POA: Diagnosis not present

## 2023-09-27 DIAGNOSIS — M069 Rheumatoid arthritis, unspecified: Secondary | ICD-10-CM | POA: Diagnosis not present

## 2023-09-27 DIAGNOSIS — M79631 Pain in right forearm: Secondary | ICD-10-CM | POA: Diagnosis not present

## 2023-09-27 DIAGNOSIS — R40241 Glasgow coma scale score 13-15, unspecified time: Secondary | ICD-10-CM | POA: Diagnosis not present

## 2023-09-27 DIAGNOSIS — M47812 Spondylosis without myelopathy or radiculopathy, cervical region: Secondary | ICD-10-CM | POA: Diagnosis not present

## 2023-09-27 DIAGNOSIS — F17210 Nicotine dependence, cigarettes, uncomplicated: Secondary | ICD-10-CM | POA: Diagnosis not present

## 2023-09-27 DIAGNOSIS — S0990XA Unspecified injury of head, initial encounter: Secondary | ICD-10-CM | POA: Diagnosis not present

## 2023-09-27 DIAGNOSIS — J45909 Unspecified asthma, uncomplicated: Secondary | ICD-10-CM | POA: Diagnosis not present

## 2023-09-27 DIAGNOSIS — S60221A Contusion of right hand, initial encounter: Secondary | ICD-10-CM | POA: Diagnosis not present

## 2023-09-27 DIAGNOSIS — S134XXA Sprain of ligaments of cervical spine, initial encounter: Secondary | ICD-10-CM | POA: Diagnosis not present

## 2023-09-28 DIAGNOSIS — R52 Pain, unspecified: Secondary | ICD-10-CM | POA: Diagnosis not present

## 2023-09-28 DIAGNOSIS — R079 Chest pain, unspecified: Secondary | ICD-10-CM | POA: Diagnosis not present

## 2023-09-29 ENCOUNTER — Other Ambulatory Visit (HOSPITAL_COMMUNITY): Payer: Self-pay

## 2023-10-04 DIAGNOSIS — M25531 Pain in right wrist: Secondary | ICD-10-CM | POA: Diagnosis not present

## 2023-10-04 DIAGNOSIS — M25521 Pain in right elbow: Secondary | ICD-10-CM | POA: Diagnosis not present

## 2023-10-04 DIAGNOSIS — M79641 Pain in right hand: Secondary | ICD-10-CM | POA: Diagnosis not present

## 2023-10-04 DIAGNOSIS — M79642 Pain in left hand: Secondary | ICD-10-CM | POA: Diagnosis not present

## 2023-10-10 DIAGNOSIS — M25571 Pain in right ankle and joints of right foot: Secondary | ICD-10-CM | POA: Diagnosis not present

## 2023-10-10 DIAGNOSIS — S93421A Sprain of deltoid ligament of right ankle, initial encounter: Secondary | ICD-10-CM | POA: Diagnosis not present

## 2023-10-11 DIAGNOSIS — M25531 Pain in right wrist: Secondary | ICD-10-CM | POA: Diagnosis not present

## 2023-10-16 DIAGNOSIS — M25532 Pain in left wrist: Secondary | ICD-10-CM | POA: Diagnosis not present

## 2023-10-16 DIAGNOSIS — M25531 Pain in right wrist: Secondary | ICD-10-CM | POA: Diagnosis not present

## 2023-10-16 DIAGNOSIS — M79642 Pain in left hand: Secondary | ICD-10-CM | POA: Diagnosis not present

## 2023-10-16 DIAGNOSIS — G5621 Lesion of ulnar nerve, right upper limb: Secondary | ICD-10-CM | POA: Diagnosis not present

## 2023-10-16 DIAGNOSIS — M25521 Pain in right elbow: Secondary | ICD-10-CM | POA: Diagnosis not present

## 2023-10-16 DIAGNOSIS — M79641 Pain in right hand: Secondary | ICD-10-CM | POA: Diagnosis not present

## 2023-10-18 DIAGNOSIS — Z3202 Encounter for pregnancy test, result negative: Secondary | ICD-10-CM | POA: Diagnosis not present

## 2023-10-18 DIAGNOSIS — N939 Abnormal uterine and vaginal bleeding, unspecified: Secondary | ICD-10-CM | POA: Diagnosis not present

## 2023-10-18 DIAGNOSIS — S8991XD Unspecified injury of right lower leg, subsequent encounter: Secondary | ICD-10-CM | POA: Diagnosis not present

## 2023-10-18 DIAGNOSIS — Z889 Allergy status to unspecified drugs, medicaments and biological substances status: Secondary | ICD-10-CM | POA: Diagnosis not present

## 2023-10-18 DIAGNOSIS — G90A Postural orthostatic tachycardia syndrome (POTS): Secondary | ICD-10-CM | POA: Diagnosis not present

## 2023-10-18 DIAGNOSIS — Z91040 Latex allergy status: Secondary | ICD-10-CM | POA: Diagnosis not present

## 2023-10-18 DIAGNOSIS — G894 Chronic pain syndrome: Secondary | ICD-10-CM | POA: Diagnosis not present

## 2023-10-18 DIAGNOSIS — R102 Pelvic and perineal pain: Secondary | ICD-10-CM | POA: Diagnosis not present

## 2023-10-18 DIAGNOSIS — Z9889 Other specified postprocedural states: Secondary | ICD-10-CM | POA: Diagnosis not present

## 2023-10-18 DIAGNOSIS — Z9079 Acquired absence of other genital organ(s): Secondary | ICD-10-CM | POA: Diagnosis not present

## 2023-10-18 DIAGNOSIS — Z01818 Encounter for other preprocedural examination: Secondary | ICD-10-CM | POA: Diagnosis not present

## 2023-10-19 ENCOUNTER — Other Ambulatory Visit (HOSPITAL_COMMUNITY): Payer: Self-pay

## 2023-10-19 ENCOUNTER — Other Ambulatory Visit: Payer: Self-pay

## 2023-10-25 DIAGNOSIS — N930 Postcoital and contact bleeding: Secondary | ICD-10-CM | POA: Diagnosis not present

## 2023-10-25 DIAGNOSIS — Z87891 Personal history of nicotine dependence: Secondary | ICD-10-CM | POA: Diagnosis not present

## 2023-10-25 DIAGNOSIS — N939 Abnormal uterine and vaginal bleeding, unspecified: Secondary | ICD-10-CM | POA: Diagnosis not present

## 2023-10-25 DIAGNOSIS — N72 Inflammatory disease of cervix uteri: Secondary | ICD-10-CM | POA: Diagnosis not present

## 2023-10-25 DIAGNOSIS — Z90721 Acquired absence of ovaries, unilateral: Secondary | ICD-10-CM | POA: Diagnosis not present

## 2023-10-25 DIAGNOSIS — J45909 Unspecified asthma, uncomplicated: Secondary | ICD-10-CM | POA: Diagnosis not present

## 2023-10-25 DIAGNOSIS — R102 Pelvic and perineal pain: Secondary | ICD-10-CM | POA: Diagnosis not present

## 2023-10-25 DIAGNOSIS — N838 Other noninflammatory disorders of ovary, fallopian tube and broad ligament: Secondary | ICD-10-CM | POA: Diagnosis not present

## 2023-10-25 DIAGNOSIS — D251 Intramural leiomyoma of uterus: Secondary | ICD-10-CM | POA: Diagnosis not present

## 2023-10-25 DIAGNOSIS — G8929 Other chronic pain: Secondary | ICD-10-CM | POA: Diagnosis not present

## 2023-10-25 DIAGNOSIS — Z9889 Other specified postprocedural states: Secondary | ICD-10-CM | POA: Diagnosis not present

## 2023-10-26 DIAGNOSIS — N930 Postcoital and contact bleeding: Secondary | ICD-10-CM | POA: Diagnosis not present

## 2023-10-26 DIAGNOSIS — Z87891 Personal history of nicotine dependence: Secondary | ICD-10-CM | POA: Diagnosis not present

## 2023-10-26 DIAGNOSIS — Z90721 Acquired absence of ovaries, unilateral: Secondary | ICD-10-CM | POA: Diagnosis not present

## 2023-10-26 DIAGNOSIS — J45909 Unspecified asthma, uncomplicated: Secondary | ICD-10-CM | POA: Diagnosis not present

## 2023-10-26 DIAGNOSIS — Z9889 Other specified postprocedural states: Secondary | ICD-10-CM | POA: Diagnosis not present

## 2023-10-26 DIAGNOSIS — D251 Intramural leiomyoma of uterus: Secondary | ICD-10-CM | POA: Diagnosis not present

## 2023-10-26 DIAGNOSIS — N838 Other noninflammatory disorders of ovary, fallopian tube and broad ligament: Secondary | ICD-10-CM | POA: Diagnosis not present

## 2023-10-26 DIAGNOSIS — N72 Inflammatory disease of cervix uteri: Secondary | ICD-10-CM | POA: Diagnosis not present

## 2023-10-26 DIAGNOSIS — R102 Pelvic and perineal pain: Secondary | ICD-10-CM | POA: Diagnosis not present

## 2023-10-26 DIAGNOSIS — G8929 Other chronic pain: Secondary | ICD-10-CM | POA: Diagnosis not present

## 2023-10-26 DIAGNOSIS — N939 Abnormal uterine and vaginal bleeding, unspecified: Secondary | ICD-10-CM | POA: Diagnosis not present

## 2023-10-27 ENCOUNTER — Other Ambulatory Visit: Payer: Self-pay

## 2023-10-27 ENCOUNTER — Other Ambulatory Visit (HOSPITAL_COMMUNITY): Payer: Self-pay

## 2023-10-27 DIAGNOSIS — N838 Other noninflammatory disorders of ovary, fallopian tube and broad ligament: Secondary | ICD-10-CM | POA: Diagnosis not present

## 2023-10-27 DIAGNOSIS — J45909 Unspecified asthma, uncomplicated: Secondary | ICD-10-CM | POA: Diagnosis not present

## 2023-10-27 DIAGNOSIS — N939 Abnormal uterine and vaginal bleeding, unspecified: Secondary | ICD-10-CM | POA: Diagnosis not present

## 2023-10-27 DIAGNOSIS — N930 Postcoital and contact bleeding: Secondary | ICD-10-CM | POA: Diagnosis not present

## 2023-10-27 DIAGNOSIS — R102 Pelvic and perineal pain: Secondary | ICD-10-CM | POA: Diagnosis not present

## 2023-10-27 DIAGNOSIS — D251 Intramural leiomyoma of uterus: Secondary | ICD-10-CM | POA: Diagnosis not present

## 2023-10-27 DIAGNOSIS — N72 Inflammatory disease of cervix uteri: Secondary | ICD-10-CM | POA: Diagnosis not present

## 2023-10-27 DIAGNOSIS — Z87891 Personal history of nicotine dependence: Secondary | ICD-10-CM | POA: Diagnosis not present

## 2023-10-31 DIAGNOSIS — S93601A Unspecified sprain of right foot, initial encounter: Secondary | ICD-10-CM | POA: Diagnosis not present

## 2023-10-31 DIAGNOSIS — S93421A Sprain of deltoid ligament of right ankle, initial encounter: Secondary | ICD-10-CM | POA: Diagnosis not present

## 2023-11-01 ENCOUNTER — Other Ambulatory Visit (HOSPITAL_COMMUNITY): Payer: Self-pay

## 2023-11-07 DIAGNOSIS — M79671 Pain in right foot: Secondary | ICD-10-CM | POA: Diagnosis not present

## 2023-11-07 DIAGNOSIS — S93409A Sprain of unspecified ligament of unspecified ankle, initial encounter: Secondary | ICD-10-CM | POA: Diagnosis not present

## 2023-11-07 DIAGNOSIS — Z48816 Encounter for surgical aftercare following surgery on the genitourinary system: Secondary | ICD-10-CM | POA: Diagnosis not present

## 2023-11-07 DIAGNOSIS — Z130 Encounter for screening for diseases of the blood and blood-forming organs and certain disorders involving the immune mechanism: Secondary | ICD-10-CM | POA: Diagnosis not present

## 2023-11-14 DIAGNOSIS — M545 Low back pain, unspecified: Secondary | ICD-10-CM | POA: Diagnosis not present

## 2023-11-16 ENCOUNTER — Other Ambulatory Visit (HOSPITAL_COMMUNITY): Payer: Self-pay

## 2023-11-16 DIAGNOSIS — M25512 Pain in left shoulder: Secondary | ICD-10-CM | POA: Diagnosis not present

## 2023-11-16 DIAGNOSIS — M542 Cervicalgia: Secondary | ICD-10-CM | POA: Diagnosis not present

## 2023-11-17 ENCOUNTER — Other Ambulatory Visit: Payer: Self-pay | Admitting: Orthopedic Surgery

## 2023-11-17 DIAGNOSIS — M542 Cervicalgia: Secondary | ICD-10-CM

## 2023-11-17 DIAGNOSIS — M25512 Pain in left shoulder: Secondary | ICD-10-CM

## 2023-11-22 ENCOUNTER — Encounter: Payer: Self-pay | Admitting: Orthopedic Surgery

## 2023-11-23 DIAGNOSIS — S93421D Sprain of deltoid ligament of right ankle, subsequent encounter: Secondary | ICD-10-CM | POA: Diagnosis not present

## 2023-11-26 ENCOUNTER — Other Ambulatory Visit: Payer: Self-pay | Admitting: Neurology

## 2023-11-26 ENCOUNTER — Ambulatory Visit
Admission: RE | Admit: 2023-11-26 | Discharge: 2023-11-26 | Disposition: A | Discharge status: Home / Self Care | Source: Ambulatory Visit | Attending: Orthopedic Surgery | Admitting: Orthopedic Surgery

## 2023-11-26 DIAGNOSIS — G43009 Migraine without aura, not intractable, without status migrainosus: Secondary | ICD-10-CM

## 2023-11-26 DIAGNOSIS — M542 Cervicalgia: Secondary | ICD-10-CM

## 2023-11-26 DIAGNOSIS — M5021 Other cervical disc displacement,  high cervical region: Secondary | ICD-10-CM | POA: Diagnosis not present

## 2023-11-26 DIAGNOSIS — M4802 Spinal stenosis, cervical region: Secondary | ICD-10-CM | POA: Diagnosis not present

## 2023-11-26 DIAGNOSIS — M25512 Pain in left shoulder: Secondary | ICD-10-CM | POA: Diagnosis not present

## 2023-11-27 NOTE — Progress Notes (Deleted)
 NEUROLOGY FOLLOW UP OFFICE NOTE  Sandra Mathis [id number]  Subjective:  Sandra Mathis is a 48 y.o. year old right-handed female with a medical history of migraines, lupus, asthma, autonomic dysfunction (followed by cardiology), current smoker, depression, and anxiety who we last saw on 04/12/23 for headaches.  To briefly review: 04/12/23: Patient first had migraines in her 67s with rebound headaches. She would have a mild headache, then stabbing pain at top of head, then she would pass out. She would have a headache 2-3 times per week. They could last 3-4 days. She had associated photophobia, phonophobia, and nausea and vomiting. She mentioned that she had a skull fracture as in infant. She thinks she was put on midodrine  and gabapentin . That helped for a year, then she stopped having headaches and stop taking medication. Patient was having maybe one mild headache every other week that advil migraine would resolve in 30 minutes.    She then had a severe headache on 03/06/23 associated with right sided weakness, intermittent aphasia, blurry vision. This headache was different from prior headaches. This headache was the whole head from the base of the skull and was most severe in the midline. It felt like someone hit her with a sledge hammer. It was 11/10. She had photophobia and phonophobia without nausea and vomiting. She went to the ED on 03/07/23. CT head, MRI brain, and MRA head and neck were all normal. She was given some headache medications (just fioricet ?). This did help. Her aphasia resolved in the ED. Her blurry vision and right sided weakness lasted 3 days. She took Fioricet  for 3 days, then things got milder, then increased again. Fioricet  was started again. She last took Fioricet  about 1.5 weeks ago. She takes medications at headache onset. She will start with advil migraine and if headache is not gone in 1 hour, she will take Fioricet . She will take advil migraine 2 out of 3 work  shifts (2 times per week). She does not have to take it when she is not working.   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 She denies pain, but has a lot of neck tightness and cracking of neck when turning her head. When she gets a headache, laying down in a cold dark room, eliminating as much stimuli as possible is what she wants to do.   She was recently prescribed a prednisone  dose pack for potential Lupus flair. She is to pick this up tomorrow and start it.   She is on Lexapro  for depression. She has no history of kidney stones.   She drinks 1 cup of coffee and 1 soda per day. She does not drink energy drinks. She is a current smoker (1 pack per week) and trying to quit. She is not on birth control (has tubes tied).   Her daughter and paternal grandmother also gets migraines.  Most recent Assessment and Plan (04/12/23): Sandra Mathis is a 48 y.o. female who presents for evaluation of headaches. She has a relevant medical history of migraines, lupus, asthma, autonomic dysfunction (followed by cardiology), current smoker, depression, and anxiety. Her neurological examination is essentially normal except antalgic gait due to knee pain. Available diagnostic data is significant for normal MRI brain and MRA head and neck. Patient's headaches sound most consistent with migraine without aura. She has about 2 headaches per week (8-10 per month) currently. She is currently taking Fioricet  for rescue, which often  causes rebound/medication overuse headaches as well. I will stop this and start treatment plan below. She also has stiffness of her neck and notices turning her neck in certain ways can trigger headache, so cervicalgia is also likely contributing.   PLAN: Migraine prevention:  Start Topamax  25 mg at bedtime  Migraine rescue:  Start Sumatriptan  100 mg as needed at headache onset, may repeat at 2 hours if needed. Stop  Fioricet . Encouraged use of prednisone  burst that was prescribed for Lupus flare as it may reset current headache cycle too Limit use of pain relievers to no more than 2 days out of week to prevent risk of rebound or medication-overuse headache. Keep headache diary Physical therapy for cervicalgia  Since their last visit: Due to ongoing headaches, I increased topamax  to 50 mg daily on 04/24/23.   Headaches?***  PT?***  MEDICATIONS:  Outpatient Encounter Medications as of 12/07/2023  Medication Sig   albuterol  (PROVENTIL ) (2.5 MG/3ML) 0.083% nebulizer solution Take 2.5 mg by nebulization every 6 (six) hours as needed.   albuterol  (VENTOLIN  HFA) 108 (90 Base) MCG/ACT inhaler Inhale 2 puffs into the lungs every 6 (six) hours as needed for wheezing or shortness of breath.   aspirin 81 MG chewable tablet Chew 81 mg by mouth daily.   azithromycin  (ZITHROMAX ) 250 MG tablet Take 1 tablet (250 mg total) by mouth daily. Take first 2 tablets together, then 1 every day until finished.   benzonatate (TESSALON) 100 MG capsule Take 100 mg by mouth 3 (three) times daily as needed for cough.   cetirizine (ZYRTEC) 10 MG tablet Take 10 mg by mouth daily.   diphenhydrAMINE  (BENADRYL ) 25 mg capsule Take 25 mg by mouth every 6 (six) hours as needed.   escitalopram  (LEXAPRO ) 10 MG tablet Take 1 tablet (10 mg total) by mouth daily.   famotidine (PEPCID) 20 MG tablet Take 20 mg by mouth 2 (two) times daily.   gabapentin  (NEURONTIN ) 100 MG capsule Take 1 capsule (100 mg total) by mouth 3 (three) times daily.   meloxicam  (MOBIC ) 15 MG tablet Take 15 mg by mouth daily.   methocarbamol (ROBAXIN) 500 MG tablet Take 500 mg by mouth every 8 (eight) hours as needed for muscle spasms.   midodrine  (PROAMATINE ) 2.5 MG tablet Take 2.5 mg by mouth in the morning and at bedtime.   midodrine  (PROAMATINE ) 2.5 MG tablet Take 1 tablet (2.5 mg total) by mouth daily.   midodrine  (PROAMATINE ) 2.5 MG tablet Take 1 tablet (2.5 mg  total) by mouth daily.   Phenylephrine-DM-GG-APAP (TYLENOL  COLD/FLU SEVERE PO) Take 10 mLs by mouth daily as needed (cough / congestion).   RABEprazole  (ACIPHEX ) 20 MG tablet Take 20 mg by mouth daily.   RABEprazole  (ACIPHEX ) 20 MG tablet Take 1 tablet (20 mg total) by mouth 2 (two) times daily.   SUMAtriptan  (IMITREX ) 100 MG tablet Take 1 tablet (100 mg total) by mouth once as needed for up to 1 dose for migraine. May repeat in 2 hours if headache persists or recurs.   topiramate  (TOPAMAX ) 25 MG tablet Take 2 tablets (50 mg total) by mouth daily.   traZODone (DESYREL) 50 MG tablet Take 50 mg by mouth at bedtime as needed for sleep.   No facility-administered encounter medications on file as of 12/07/2023.    PAST MEDICAL HISTORY: Past Medical History:  Diagnosis Date   Arthritis    Asthma    Low blood pressure    Lupus    POTS (postural orthostatic tachycardia syndrome)  Rheumatoid arthritis (HCC)     PAST SURGICAL HISTORY: No past surgical history on file.  ALLERGIES: Allergies  Allergen Reactions   Iodine Hives and Shortness Of Breath   Latex Shortness Of Breath and Rash   Lithium Shortness Of Breath and Rash   Morphine And Codeine Other (See Comments)    Cardiac arrest   Oxycontin [Oxycodone] Shortness Of Breath   Penicillins Other (See Comments)    Respiratory depression   Percocet [place] ] Shortness Of Breath   Pineapple Flavoring Agent (Non-Screening) Shortness Of Breath   Shellfish Allergy Shortness Of Breath and Rash   Sulfa Antibiotics Shortness Of Breath   Tramadol Shortness Of Breath and Rash   Adhesive [Tape]    Celebrex [Celecoxib] Other (See Comments)    GI distress   Codeine    Doxycycline    Doxycycline Diarrhea   Iodine    Lithium Other (See Comments)        Lyrica [Pregabalin] Other (See Comments)    Severe gastric distress    Morphine And Codeine Other (See Comments)    Cardiac arrest     Penicillins Other (See Comments)     Respiratory distress    Percocet [Oxycodone-Acetaminophen ]    Singulair [Montelukast Sodium]    Singulair [Montelukast]     GI distress   Sulfa Antibiotics    Adhesive [Tape] Rash    FAMILY HISTORY: No family history on file.  SOCIAL HISTORY: Social History   Tobacco Use   Smoking status: Every Day    Types: Cigarettes   Smokeless tobacco: Never  Vaping Use   Vaping status: Never Used  Substance Use Topics   Alcohol use: Yes    Comment: social   Drug use: Never   Social History   Social History Narrative   ** Merged History Encounter **    Are you right handed or left handed? right   Are you currently employed ? yes   What is your current occupation? [employer]    Do you live at home alone? no   Who lives with you? Husband    What type of home do you live in: 1 story or 2 story?  1 story 4 steps           Objective:  Vital Signs:  There were no vitals taken for this visit.  ***  Labs and Imaging review: New results: External labs: 10/26/23: BMP unremarkable CBC unremarkable  Ferritin (07/23/23): 149   MRI cervical spine wo contrast (11/26/23): IMPRESSION: 1. Moderate right C5-6 neural foraminal stenosis secondary to small left subarticular disc protrusion and uncovertebral spurring. 2. Left subarticular disc protrusion with annular fissure at C4-5 without associated stenosis. 3. Tiny central disc protrusions at C2-3 and C3-4 without associated stenosis.  MRI left shoulder wo contrast (11/26/23): IMPRESSION: No significant subacute or chronic bony or soft tissue injury.   Mild impingement change to the humeral head deep to the infraspinatus insertion. The tendon is unremarkable.   Trace bursal fluid.  Previously reviewed results:      Lab Results  Component Value Date    HGBA1C 5.3 07/23/2019      03/07/23: CMP unremarkable CBC w/ diff unremarkable   External labs: 04/06/23: TSH wnl HbA1c: 5.7   Mg (06/29/22): 2.1 TSH (08/17/21):  0.76 HbA1c (08/07/20): 5.7 RF (10/17/19): positive (titer 320) CCP (10/17/19): wnl   Imaging: CT head wo contrast (03/07/23): FINDINGS: Brain: No acute intracranial hemorrhage. No midline shift or mass effect. Gray-white differentiation maintained. Unremarkable  appearance of the ventricular system.   Vascular: Unremarkable.   Skull: No acute fracture.  No aggressive bone lesion identified.   Sinuses/Orbits: Unremarkable appearance of the orbits. Mastoid air cells clear. No middle ear effusion. No significant sinus disease.   Other: None   IMPRESSION: Negative for acute intracranial abnormality   MRI brain and MRA head and neck (03/07/23): FINDINGS: MRI HEAD FINDINGS   Brain: No acute infarct, mass effect or extra-axial collection. No chronic microhemorrhage or siderosis. Normal white matter signal, parenchymal volume and CSF spaces. The midline structures are normal. There is no abnormal contrast enhancement.   Vascular: Normal flow voids.   Skull and upper cervical spine: Normal marrow signal.   Sinuses/Orbits: Negative.   Other: None.   MRA HEAD FINDINGS   POSTERIOR CIRCULATION:   --Vertebral arteries: Normal   --Inferior cerebellar arteries: Normal.   --Basilar artery: Normal.   --Superior cerebellar arteries: Normal.   --Posterior cerebral arteries: Normal.   ANTERIOR CIRCULATION:   --Intracranial internal carotid arteries: Normal.   --Anterior cerebral arteries (ACA): Normal.   --Middle cerebral arteries (MCA): Normal.   MRA NECK FINDINGS   Normal carotid and vertebral arteries. Normal 3 vessel branch pattern of the aortic arch. No stenosis or other abnormality.   IMPRESSION: 1. Normal MRI of the brain. 2. Normal MRA of the head and neck.  Assessment/Plan:  This is [employer], a 49 y.o. female with: ***   Plan: ***  Return to clinic in ***  Total time spent reviewing records, interview, history/exam, documentation, and  coordination of care on day of encounter:  *** min  Venetia Potters, MD

## 2023-11-28 NOTE — Progress Notes (Unsigned)
 NEUROLOGY FOLLOW UP OFFICE NOTE  Sandra Mathis [id number]  Subjective:  Sandra Mathis is a 48 y.o. year old right-handed female with a medical history of migraines, lupus, asthma, autonomic dysfunction (followed by cardiology), current smoker, depression, and anxiety who we last saw on 04/12/23 for headaches.  To briefly review: 04/12/23: Patient first had migraines in her 61s with rebound headaches. She would have a mild headache, then stabbing pain at top of head, then she would pass out. She would have a headache 2-3 times per week. They could last 3-4 days. She had associated photophobia, phonophobia, and nausea and vomiting. She mentioned that she had a skull fracture as in infant. She thinks she was put on midodrine  and gabapentin . That helped for a year, then she stopped having headaches and stop taking medication. Patient was having maybe one mild headache every other week that advil migraine would resolve in 30 minutes.    She then had a severe headache on 03/06/23 associated with right sided weakness, intermittent aphasia, blurry vision. This headache was different from prior headaches. This headache was the whole head from the base of the skull and was most severe in the midline. It felt like someone hit her with a sledge hammer. It was 11/10. She had photophobia and phonophobia without nausea and vomiting. She went to the ED on 03/07/23. CT head, MRI brain, and MRA head and neck were all normal. She was given some headache medications (just fioricet ?). This did help. Her aphasia resolved in the ED. Her blurry vision and right sided weakness lasted 3 days. She took Fioricet  for 3 days, then things got milder, then increased again. Fioricet  was started again. She last took Fioricet  about 1.5 weeks ago. She takes medications at headache onset. She will start with advil migraine and if headache is not gone in 1 hour, she will take Fioricet . She will take advil migraine 2 out of 3 work  shifts (2 times per week). She does not have to take it when she is not working.   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 She denies pain, but has a lot of neck tightness and cracking of neck when turning her head. When she gets a headache, laying down in a cold dark room, eliminating as much stimuli as possible is what she wants to do.   She was recently prescribed a prednisone  dose pack for potential Lupus flair. She is to pick this up tomorrow and start it.   She is on Lexapro  for depression. She has no history of kidney stones.   She drinks 1 cup of coffee and 1 soda per day. She does not drink energy drinks. She is a current smoker (1 pack per week) and trying to quit. She is not on birth control (has tubes tied).   Her daughter and paternal grandmother also gets migraines.  Most recent Assessment and Plan (04/12/23): Sandra Mathis is a 48 y.o. female who presents for evaluation of headaches. She has a relevant medical history of migraines, lupus, asthma, autonomic dysfunction (followed by cardiology), current smoker, depression, and anxiety. Her neurological examination is essentially normal except antalgic gait due to knee pain. Available diagnostic data is significant for normal MRI brain and MRA head and neck. Patient's headaches sound most consistent with migraine without aura. She has about 2 headaches per week (8-10 per month) currently. She is currently taking Fioricet  for rescue, which often  causes rebound/medication overuse headaches as well. I will stop this and start treatment plan below. She also has stiffness of her neck and notices turning her neck in certain ways can trigger headache, so cervicalgia is also likely contributing.   PLAN: Migraine prevention:  Start Topamax  25 mg at bedtime  Migraine rescue:  Start Sumatriptan  100 mg as needed at headache onset, may repeat at 2 hours if needed. Stop  Fioricet . Encouraged use of prednisone  burst that was prescribed for Lupus flare as it may reset current headache cycle too Limit use of pain relievers to no more than 2 days out of week to prevent risk of rebound or medication-overuse headache. Keep headache diary Physical therapy for cervicalgia  Since their last visit: Due to ongoing headaches, I increased topamax  to 50 mg daily on 04/24/23.   Headaches?***  PT?***  MEDICATIONS:  Outpatient Encounter Medications as of 11/29/2023  Medication Sig   albuterol  (PROVENTIL ) (2.5 MG/3ML) 0.083% nebulizer solution Take 2.5 mg by nebulization every 6 (six) hours as needed.   albuterol  (VENTOLIN  HFA) 108 (90 Base) MCG/ACT inhaler Inhale 2 puffs into the lungs every 6 (six) hours as needed for wheezing or shortness of breath.   aspirin 81 MG chewable tablet Chew 81 mg by mouth daily.   azithromycin  (ZITHROMAX ) 250 MG tablet Take 1 tablet (250 mg total) by mouth daily. Take first 2 tablets together, then 1 every day until finished.   benzonatate (TESSALON) 100 MG capsule Take 100 mg by mouth 3 (three) times daily as needed for cough.   cetirizine (ZYRTEC) 10 MG tablet Take 10 mg by mouth daily.   diphenhydrAMINE  (BENADRYL ) 25 mg capsule Take 25 mg by mouth every 6 (six) hours as needed.   escitalopram  (LEXAPRO ) 10 MG tablet Take 1 tablet (10 mg total) by mouth daily.   famotidine (PEPCID) 20 MG tablet Take 20 mg by mouth 2 (two) times daily.   gabapentin  (NEURONTIN ) 100 MG capsule Take 1 capsule (100 mg total) by mouth 3 (three) times daily.   meloxicam  (MOBIC ) 15 MG tablet Take 15 mg by mouth daily.   methocarbamol (ROBAXIN) 500 MG tablet Take 500 mg by mouth every 8 (eight) hours as needed for muscle spasms.   midodrine  (PROAMATINE ) 2.5 MG tablet Take 2.5 mg by mouth in the morning and at bedtime.   midodrine  (PROAMATINE ) 2.5 MG tablet Take 1 tablet (2.5 mg total) by mouth daily.   midodrine  (PROAMATINE ) 2.5 MG tablet Take 1 tablet (2.5 mg  total) by mouth daily.   Phenylephrine-DM-GG-APAP (TYLENOL  COLD/FLU SEVERE PO) Take 10 mLs by mouth daily as needed (cough / congestion).   RABEprazole  (ACIPHEX ) 20 MG tablet Take 20 mg by mouth daily.   RABEprazole  (ACIPHEX ) 20 MG tablet Take 1 tablet (20 mg total) by mouth 2 (two) times daily.   SUMAtriptan  (IMITREX ) 100 MG tablet Take 1 tablet (100 mg total) by mouth once as needed for up to 1 dose for migraine. May repeat in 2 hours if headache persists or recurs.   topiramate  (TOPAMAX ) 25 MG tablet Take 2 tablets (50 mg total) by mouth daily.   traZODone (DESYREL) 50 MG tablet Take 50 mg by mouth at bedtime as needed for sleep.   No facility-administered encounter medications on file as of 11/29/2023.    PAST MEDICAL HISTORY: Past Medical History:  Diagnosis Date   Arthritis    Asthma    Low blood pressure    Lupus    POTS (postural orthostatic tachycardia syndrome)  Rheumatoid arthritis (HCC)     PAST SURGICAL HISTORY: No past surgical history on file.  ALLERGIES: Allergies  Allergen Reactions   Iodine Hives and Shortness Of Breath   Latex Shortness Of Breath and Rash   Lithium Shortness Of Breath and Rash   Morphine And Codeine Other (See Comments)    Cardiac arrest   Oxycontin [Oxycodone] Shortness Of Breath   Penicillins Other (See Comments)    Respiratory depression   Percocet [Oxycodone-Acetaminophen ] Shortness Of Breath   Pineapple Flavoring Agent (Non-Screening) Shortness Of Breath   Shellfish Allergy Shortness Of Breath and Rash   Sulfa Antibiotics Shortness Of Breath   Tramadol Shortness Of Breath and Rash   Adhesive [Tape]    Celebrex [Celecoxib] Other (See Comments)    GI distress   Codeine    Doxycycline    Doxycycline Diarrhea   Iodine    Lithium Other (See Comments)        Lyrica [Pregabalin] Other (See Comments)    Severe gastric distress    Morphine And Codeine Other (See Comments)    Cardiac arrest     Penicillins Other (See Comments)     Respiratory distress    Percocet [Oxycodone-Acetaminophen ]    Singulair [Montelukast Sodium]    Singulair [Montelukast]     GI distress   Sulfa Antibiotics    Adhesive [Tape] Rash    FAMILY HISTORY: No family history on file.  SOCIAL HISTORY: Social History   Tobacco Use   Smoking status: Every Day    Types: Cigarettes   Smokeless tobacco: Never  Vaping Use   Vaping status: Never Used  Substance Use Topics   Alcohol use: Yes    Comment: social   Drug use: Never   Social History   Social History Narrative   ** Merged History Encounter **    Are you right handed or left handed? right   Are you currently employed ? yes   What is your current occupation? [employer]    Do you live at home alone? no   Who lives with you? Husband    What type of home do you live in: 1 story or 2 story?  1 story 4 steps           Objective:  Vital Signs:  There were no vitals taken for this visit.  ***  Labs and Imaging review: New results: External labs: 10/26/23: BMP unremarkable CBC unremarkable  Ferritin (07/23/23): 149   MRI cervical spine wo contrast (11/26/23): IMPRESSION: 1. Moderate right C5-6 neural foraminal stenosis secondary to small left subarticular disc protrusion and uncovertebral spurring. 2. Left subarticular disc protrusion with annular fissure at C4-5 without associated stenosis. 3. Tiny central disc protrusions at C2-3 and C3-4 without associated stenosis.  MRI left shoulder wo contrast (11/26/23): IMPRESSION: No significant subacute or chronic bony or soft tissue injury.   Mild impingement change to the humeral head deep to the infraspinatus insertion. The tendon is unremarkable.   Trace bursal fluid.  Previously reviewed results:      Lab Results  Component Value Date    HGBA1C 5.3 07/23/2019      03/07/23: CMP unremarkable CBC w/ diff unremarkable   External labs: 04/06/23: TSH wnl HbA1c: 5.7   Mg (06/29/22): 2.1 TSH (08/17/21):  0.76 HbA1c (08/07/20): 5.7 RF (10/17/19): positive (titer 320) CCP (10/17/19): wnl   Imaging: CT head wo contrast (03/07/23): FINDINGS: Brain: No acute intracranial hemorrhage. No midline shift or mass effect. Gray-white differentiation maintained. Unremarkable  appearance of the ventricular system.   Vascular: Unremarkable.   Skull: No acute fracture.  No aggressive bone lesion identified.   Sinuses/Orbits: Unremarkable appearance of the orbits. Mastoid air cells clear. No middle ear effusion. No significant sinus disease.   Other: None   IMPRESSION: Negative for acute intracranial abnormality   MRI brain and MRA head and neck (03/07/23): FINDINGS: MRI HEAD FINDINGS   Brain: No acute infarct, mass effect or extra-axial collection. No chronic microhemorrhage or siderosis. Normal white matter signal, parenchymal volume and CSF spaces. The midline structures are normal. There is no abnormal contrast enhancement.   Vascular: Normal flow voids.   Skull and upper cervical spine: Normal marrow signal.   Sinuses/Orbits: Negative.   Other: None.   MRA HEAD FINDINGS   POSTERIOR CIRCULATION:   --Vertebral arteries: Normal   --Inferior cerebellar arteries: Normal.   --Basilar artery: Normal.   --Superior cerebellar arteries: Normal.   --Posterior cerebral arteries: Normal.   ANTERIOR CIRCULATION:   --Intracranial internal carotid arteries: Normal.   --Anterior cerebral arteries (ACA): Normal.   --Middle cerebral arteries (MCA): Normal.   MRA NECK FINDINGS   Normal carotid and vertebral arteries. Normal 3 vessel branch pattern of the aortic arch. No stenosis or other abnormality.   IMPRESSION: 1. Normal MRI of the brain. 2. Normal MRA of the head and neck.  Assessment/Plan:  This is [employer], a 48 y.o. female with: ***   Plan: ***  Return to clinic in ***  Total time spent reviewing records, interview, history/exam, documentation, and  coordination of care on day of encounter:  *** min  Venetia Potters, MD

## 2023-11-29 ENCOUNTER — Encounter: Payer: Self-pay | Admitting: Neurology

## 2023-11-29 ENCOUNTER — Ambulatory Visit (INDEPENDENT_AMBULATORY_CARE_PROVIDER_SITE_OTHER): Admitting: Neurology

## 2023-11-29 ENCOUNTER — Other Ambulatory Visit: Payer: Self-pay

## 2023-11-29 VITALS — BP 104/69 | HR 94 | Ht 65.0 in | Wt 210.0 lb

## 2023-11-29 DIAGNOSIS — M542 Cervicalgia: Secondary | ICD-10-CM | POA: Diagnosis not present

## 2023-11-29 DIAGNOSIS — G43009 Migraine without aura, not intractable, without status migrainosus: Secondary | ICD-10-CM | POA: Diagnosis not present

## 2023-11-29 MED ORDER — TOPIRAMATE 25 MG PO TABS
75.0000 mg | ORAL_TABLET | Freq: Every day | ORAL | 5 refills | Status: AC
  Filled 2023-11-29: qty 90, 30d supply, fill #0
  Filled 2023-12-24: qty 90, 30d supply, fill #1
  Filled 2024-03-27: qty 90, 30d supply, fill #2
  Filled 2024-04-24: qty 90, 30d supply, fill #3
  Filled 2024-05-21: qty 90, 30d supply, fill #4
  Filled 2024-07-12 (×2): qty 90, 30d supply, fill #5

## 2023-11-29 NOTE — Patient Instructions (Signed)
-  For migraines: Migraine prevention:  Increase topamax  to 75 mg daily Migraine rescue:  Continue sumatriptan  100 mg as needed at headache onset, can repeat in 2 hours if needed Limit use of pain relievers to no more than 2 days out of week to prevent risk of rebound or medication-overuse headache. Keep headache diary  I think physical therapy for your neck would also help with headaches. Hopefully this can be done with your upcoming PT.  I will see you back in clinic in about 6 months. Please let me know if you have any questions or concerns in the meantime.  The physicians and staff at [facility] are committed to providing excellent care. You may receive a survey requesting feedback about your experience at our office. We strive to receive very good responses to the survey questions. If you feel that your experience would prevent you from giving the office a very good  response, please contact our office to try to remedy the situation. We may be reached at [phone]. Thank you for taking the time out of your busy day to complete the survey.  Venetia Potters, MD [facility]

## 2023-12-01 DIAGNOSIS — G5621 Lesion of ulnar nerve, right upper limb: Secondary | ICD-10-CM | POA: Diagnosis not present

## 2023-12-07 ENCOUNTER — Ambulatory Visit: Admitting: Neurology

## 2023-12-07 DIAGNOSIS — M25621 Stiffness of right elbow, not elsewhere classified: Secondary | ICD-10-CM | POA: Diagnosis not present

## 2023-12-15 ENCOUNTER — Encounter: Payer: Self-pay | Admitting: Neurology

## 2023-12-20 ENCOUNTER — Other Ambulatory Visit (HOSPITAL_COMMUNITY): Payer: Self-pay

## 2023-12-20 DIAGNOSIS — M542 Cervicalgia: Secondary | ICD-10-CM | POA: Diagnosis not present

## 2023-12-20 DIAGNOSIS — M25512 Pain in left shoulder: Secondary | ICD-10-CM | POA: Diagnosis not present

## 2023-12-21 ENCOUNTER — Encounter: Payer: Self-pay | Admitting: Pharmacist

## 2023-12-21 ENCOUNTER — Other Ambulatory Visit: Payer: Self-pay

## 2023-12-21 ENCOUNTER — Other Ambulatory Visit (HOSPITAL_COMMUNITY): Payer: Self-pay

## 2023-12-21 MED ORDER — PREDNISONE 5 MG (21) PO TBPK
ORAL_TABLET | ORAL | 1 refills | Status: AC
  Filled 2023-12-21 (×2): qty 21, 6d supply, fill #0
  Filled 2023-12-24: qty 21, 6d supply, fill #1

## 2023-12-24 ENCOUNTER — Other Ambulatory Visit (HOSPITAL_COMMUNITY): Payer: Self-pay

## 2023-12-25 ENCOUNTER — Other Ambulatory Visit (HOSPITAL_COMMUNITY): Payer: Self-pay

## 2023-12-25 ENCOUNTER — Other Ambulatory Visit: Payer: Self-pay

## 2023-12-25 MED ORDER — RABEPRAZOLE SODIUM 20 MG PO TBEC
20.0000 mg | DELAYED_RELEASE_TABLET | Freq: Two times a day (BID) | ORAL | 2 refills | Status: AC
  Filled 2023-12-25: qty 180, 90d supply, fill #0
  Filled 2024-03-27: qty 180, 90d supply, fill #1

## 2023-12-26 ENCOUNTER — Other Ambulatory Visit (HOSPITAL_COMMUNITY): Payer: Self-pay

## 2023-12-26 ENCOUNTER — Other Ambulatory Visit: Payer: Self-pay

## 2024-03-27 ENCOUNTER — Other Ambulatory Visit (HOSPITAL_COMMUNITY): Payer: Self-pay

## 2024-03-27 ENCOUNTER — Other Ambulatory Visit: Payer: Self-pay

## 2024-03-27 MED ORDER — GABAPENTIN 100 MG PO CAPS
100.0000 mg | ORAL_CAPSULE | Freq: Three times a day (TID) | ORAL | 2 refills | Status: AC
  Filled 2024-03-27: qty 90, 30d supply, fill #0
  Filled 2024-04-24: qty 90, 30d supply, fill #1
  Filled 2024-05-21: qty 90, 30d supply, fill #2

## 2024-04-25 ENCOUNTER — Other Ambulatory Visit (HOSPITAL_COMMUNITY): Payer: Self-pay

## 2024-04-25 ENCOUNTER — Other Ambulatory Visit: Payer: Self-pay

## 2024-05-22 ENCOUNTER — Other Ambulatory Visit: Payer: Self-pay

## 2024-05-22 ENCOUNTER — Other Ambulatory Visit (HOSPITAL_COMMUNITY): Payer: Self-pay

## 2024-05-23 ENCOUNTER — Ambulatory Visit: Admitting: Neurology

## 2024-05-27 NOTE — Discharge Summary (Signed)
 "  Hospital Medicine Discharge Summary   Demographics: Sandra Mathis y.o. 01/18/1976 MRN: [id number]    Extended Emergency Contact Information Primary Emergency Contact: Kaster,David Home Phone: [phone] Mobile Phone: [phone] Relation: Spouse  Full Code  Admit Date: 05/20/2024                            Attending Physician: Shasta Hadassah Hammed, MD Discharge Date: 05/27/2024  Primary Care Provider: Dannielle Sherrilyn Lunger, [place]   [phone]  Consults during this admission: Consult Orders             IP CONSULT TO NEUROLOGY       Specialty:  Neurology  Provider:  (Not yet assigned)      IP CONSULT TO ELECTROPHYSIOLOGY       Provider:  (Not yet assigned)      IP CONSULT TO CARDIOLOGY       Specialty:  Cardiology  Provider:  (Not yet assigned)      IP CONSULT TO CARDIOLOGY       Specialty:  Cardiology  Provider:  (Not yet assigned)             Active & Resolved Diagnosis: Principal Problem:   Chest pain Active Problems:   Acute pericarditis (CMD)   Sinus bradycardia   Rheumatoid factor positive   POTS (postural orthostatic tachycardia syndrome)   Recurrent major depressive disorder   Gastro-esophageal reflux disease with esophagitis, without bleeding   Migraines   Nonspecific paroxysmal spell Resolved Problems:   * No resolved hospital problems. *  Disposition: Patient discharged to Home in stable condition.  Discharge follow-up recommendations : See Hospital Course   Hospital Course: Sandra Mathis is a 48 year old female with a history of rheumatoid arthritis, systemic lupus erythematosus, postural orthostatic tachycardia syndrome (POTS) on midodrine , tobacco use, GERD with dysphagia and esophagitis, asthma, COPD, obstructive sleep apnea, and a family history of premature coronary artery disease, who was admitted following transoral incisionless fundoplication (TIF) for refractory GERD.  Her principal problem during  this hospitalization was chest pain following the TIF procedure. Initial evaluation included serial troponins, ECGs, and chest imaging, which revealed anterior T wave inversions without new changes, negative troponins, and chest x-ray findings of low lung volumes and accentuated cardiac silhouette, but no acute pulmonary process. Cardiology consultation determined the chest pain was most consistent with acute pericarditis, likely autoimmune in etiology given her history of RA and SLE, and recommended colchicine therapy, avoidance of NSAIDs due to recent fundoplication, and outpatient cardiology follow-up.  During the admission, she developed persistent sinus bradycardia, with heart rates dropping into the 30s and 40s, including a rapid response event for symptomatic bradycardia and possible unwitnessed syncope. Cardiology and electrophysiology consultants attributed the bradycardia to increased vagal tone from pericarditis pain, recent GI procedure, and gabapentin , with no evidence of AV block or need for pacing; recommendations included telemetry monitoring, correction of metabolic derangements, and avoidance of AV nodal blockers. Neurology was consulted for transient loss of awareness, sensory disturbance, and new visual and olfactory hallucinations; continuous video EEG monitoring over three days showed no evidence of seizures or epileptiform activity, and neurology recommended outpatient follow-up and titration of midodrine  for POTS.  Gastroenterology managed her post-procedural care, confirming no leak on esophagram and advancing her diet from clear liquids to soft foods over two weeks, with continued BID PPI and supportive care for pain and nausea. She experienced intermittent headaches and vomiting attributed  to migraines, managed with antiemetics and continuation of her home migraine regimen.  Other issues addressed during the admission included recurrent dizziness and chronotropic incompetence, for  which an exercise treadmill stress test was performed and was negative for acute ischemia, and ongoing management of chronic conditions including depression, asthma, and POTS. DVT prophylaxis was provided with enoxaparin. Several outpatient referrals were arranged, including cardiology, neurology, and rheumatology.  By discharge, her chest pain and reflux symptoms had improved, bradycardia was managed with midodrine  titration, and no seizure activity was detected on EEG. She was discharged with a Ziopatch for ambulatory rhythm monitoring and instructions for outpatient follow-up.  Of note, prior to discharge, patient voiced multiple concerns, including lack of transportation, lack of support in the area, being uninsured, the hospital being responsible for there botched procedure.  She was not satisfied with any of the answers provided, she actually told me to discharge me at noon encourage fluids later demanding pulmonology consult because her chest x-ray showed very mild basilar atelectasis.  When I explained to patient pulmonology consult is not indicated based on chest x-ray results and the fact that clinically she is stable and has no hypoxia, patient responded with are you a pulmonologist. I reiterated at this there is no for indication for pulmonology consult or further testing and noted that she can be discharge with appropriate follow up as established. All other consulted have signed off on her case at this time.   * No procedures listed * 12/22 1055 Multiday/Long-Term/Continuous EEG 12/20 1158 Multiday/Long-Term/Continuous EEG      Wound / Incision Assessment: Refer to Chart Review and Media Tab for images if available.  Wound 10/25/23 Laparoscopic site(s) Abdomen Left;Lower (Active)  Date First Assessed/Time First Assessed: 10/25/23 1519   Primary Wound Type: Laparoscopic site(s)  Location: Abdomen  Wound Location Orientation: Left;Lower     Wound 10/25/23 Laparoscopic site(s)  Abdomen Lower;Right;Lateral (Active)  Date First Assessed/Time First Assessed: 10/25/23 1519   Primary Wound Type: Laparoscopic site(s)  Location: Abdomen  Wound Location Orientation: Lower;Right;Lateral     Wound 10/25/23 Laparoscopic site(s) Umbilicus (Active)  Date First Assessed/Time First Assessed: 10/25/23 1631   Primary Wound Type: Laparoscopic site(s)  Location: Umbilicus    Vital Sign Range:  Temp:  [98.1 F (36.7 C)-99.1 F (37.3 C)] 99.1 F (37.3 C) Heart Rate:  [52-100] 86 Resp:  [16-20] 18 BP: (99-125)/(48-69) 118/66  General: No acute distress, angry   HEENT: Normocephalic, atraumatic, no visible JVD   Cardiac: no peripheral edema, patient did not allow me to examine her   Lungs: normal respiratory effort, no audible wheezing.    Extremities: Warm and well-perfused  Neurological: AAOx3, no obvious focal deficits.      Discharge Medications     New Medications      Sig Disp Refill Start End  colchicine 0.6 mg tablet  Take 1 tablet (0.6 mg total) by mouth 2 (two) times a day.  180 tablet  0     lidocaine 2 % viscous solution Commonly known as: XYLOCAINE  Swish and swallow 5 mL by mouth every 2 (two) hours as needed for mild pain. Maximum of 8 doses per 24-hour period. (Take 5 mL by mouth every 2 (two) hours as needed for mild pain (1-3).)  200 mL  0     ondansetron  4 mg disintegrating tablet Commonly known as: ZOFRAN -ODT  Dissolve 1 tablet (4 mg total) on tongue every 4 (four) hours as needed for nausea or vomiting.  20 tablet  0         Modified Medications      Sig Disp Refill Start End  famotidine 20 mg tablet Commonly known as: PEPCID What changed:  when to take this reasons to take this  Take 1 tablet (20 mg total) by mouth 2 (two) times a day as needed for heartburn (breakthrough reflux on aciphex ).  180 tablet  0     midodrine  2.5 mg tablet Commonly known as: PROAMATINE  What changed: when to take this  Take 1 tablet (2.5 mg  total) by mouth 3 (three) times a day before meals. (Take 1 tablet (2.5 mg total) by mouth in the morning and 1 tablet (2.5 mg total) at noon and 1 tablet (2.5 mg total) in the evening. Take before meals.)  90 tablet  0         Medications To Continue      Sig Disp Refill Start End  * albuterol  HFA 90 mcg/actuation inhaler Commonly known as: PROVENTIL  HFA;VENTOLIN  HFA;PROAIR  HFA  Inhale 2 puffs every 6 (six) hours as needed for wheezing.  1 each  5     * albuterol  2.5 mg /3 mL (0.083 %) nebulizer solution  Take 2.5 mg by nebulization every 4 (four) hours as needed for wheezing.  25 each  1     cetirizine 10 mg tablet Commonly known as: ZyrTEC  Take 10 mg by mouth every morning.   0     diphenhydrAMINE  25 mg capsule Commonly known as: BENADRYL   Take 75 mg by mouth daily as needed for allergies.   0     escitalopram  10 mg tablet Commonly known as: LEXAPRO   Take 1 tablet (10 mg total) by mouth daily.  90 tablet  3     gabapentin  100 mg capsule Commonly known as: NEURONTIN   Take 2 capsules (200 mg total) by mouth every morning.   0     HYDROmorphone  2 mg tablet Commonly known as: DILAUDID   Take 2 mg by mouth every 4 (four) hours as needed for moderate pain (4-6).   0     lifitegrast 5 % Dpet ophthalmic solution Commonly known as: XIIDRA  Administer 1 drop into both eyes 2 (two) times a day.   0     RABEprazole  20 mg DR tablet Commonly known as: ACIPHEX   Take 1 tablet (20 mg total) by mouth 2 (two) times daily.  180 tablet  2     SUMAtriptan  100 mg tablet Commonly known as: IMITREX   Take 100 mg by mouth once as needed for migraine.   0     topiramate  25 mg tablet Commonly known as: TOPAMAX   Take 75 mg by mouth daily.   0     Tylenol  325 mg Cap Generic drug: acetaminophen   Take 1,000 mg by mouth every morning. and may take an additional 1,000mg  at bedtime as needed for pain   0        * * There are duplicate medications prescribed to the patient           Stopped Medications    ibuprofen 800 mg tablet Commonly known as: MOTRIN   ketorolac  10 mg tablet Commonly known as: TORADOL        Discharge Orders     Activity Instructions:     Details:    Activity Limits: As you are able   Call Provider for (Adult/OB):     Details:    Call Provider For (Adult/OB):  Short of breath Chest  pain Dizzy Swelling Heart racing/palpitations     Discharge Diet (specify)     Details:    Diet type: Mediterranean   Full Code     Holter monitor - 8-14 days     Details:    How long will the Holter Monitor be worn?: 14 days   Holter Placement: In Person   Reason for Exam: Bradycardia   Preferred Scheduling Location: Main [facility] - [place]   Lifting Limits:     Details:    Lifting Limits: No lifting limits   Ambulatory referral to Cardiology     Details:    Are you referring from the ED?: No   Reason for referral: Cardiology - Adult   Ambulatory referral to Gastroenterology     Details:    Reason for Referral: General GI Consult   Ambulatory referral to Neurology     Details:    Reason for Referral: General Neurology         Lab Results  Component Value Date/Time   HGB 13.4 05/27/2024 09:07 AM   HCT 39.6 05/27/2024 09:07 AM   WBC 7.20 05/27/2024 09:07 AM   PLT 185 05/27/2024 09:07 AM   Lab Results  Component Value Date/Time   NA 138 05/27/2024 09:07 AM   K 4.2 05/27/2024 09:07 AM   CREATININE 0.94 05/27/2024 09:07 AM   BUN 14 05/27/2024 09:07 AM   GLUCOSE 95 05/27/2024 09:07 AM    Pertinent Imaging: XR Chest 1 View  Final Result by Karlton Marilou Ferron, MD (12/22 1203)  XR CHEST 1 VIEW, 05/27/2024 6:47 AM    INDICATION:sob, Gastro-esophageal reflux disease with esophagitis, without   bleeding \ K21.00 Gastro-esophageal reflux disease with esophagitis,   without bleeding   COMPARISON: Chest radiograph 05/21/2024    FINDINGS:     Supportive devices: None   Cardiovascular/lungs/pleura: Cardiac silhouette and pulmonary vasculature   are within normal limits. Blunting of the right costophrenic angle. No   pleural effusion. No pneumothorax.   Other: No interval osseous of soft tissue change.    IMPRESSION:  Right basilar subsegmental atelectasis.      CT Head WO Contrast W Quant CT Tiss Character When Performed  Final Result by Lynwood Charlie Canny, MD (12/19 1227)  CT HEAD WITHOUT CONTRAST, 05/24/2024 11:33 AM    INDICATION: Stroke eval, Gastro-esophageal reflux disease with   esophagitis, without bleeding \ K21.00 Gastro-esophageal reflux disease   with esophagitis, without bleeding     COMPARISON: None    TECHNIQUE: Axial CT images of the brain from skull base to vertex,   including portions of the face and sinuses, were obtained without   contrast. Supplemental 2D reformatted images were generated and reviewed   as needed.    All CT scans at [facility] and [facility]   Imaging are performed using radiation dose optimization techniques as   appropriate to a performed exam, including but not limited to one or more   of the following: automatic exposure control, adjustment of the mA and/or   kV according to patient size, use of iterative reconstruction technique.   In addition, our institution participates in a radiation dose monitoring   program to optimize patient radiation exposure.    FINDINGS:  Calvarium/skull base: No evidence of acute fracture or destructive lesion.   Mastoids and middle ears demonstrate no substantial mucosal disease. Scalp   EEG leads.    Paranasal sinuses: No air fluid levels.    Brain: No acute  large vascular territory infarct. No mass effect. No   hydrocephalus. No acute hemorrhage.    IMPRESSION:  No acute intracranial abnormality. However, CT is relatively insensitive   for the detection of acute infarct within the first 24-48 hours, and MRI   may be indicated  if there is high clinical suspicion.    FL Esophagram  Final Result by Manuelita Arlean Nevelyn Arnita, MD (12/17 1204)  FL ESOPHAGRAM, 05/22/2024 9:26 AM     INDICATION: persistent pain after procedure conern for leak   (gastrografin), Gastro-esophageal reflux disease with esophagitis, without   bleeding \ K21.00 Gastro-esophageal reflux disease with esophagitis,   without bleeding   COMPARISON: Esophagram 02/21/2021, chest radiograph 05/21/2024    TECHNIQUE:     Scout radiograph(s) of the abdomen and other necessary portions of the   torso were initially obtained. Focused fluoroscopic evaluation of the   lower esophagus and GE junction was then performed after oral   administration of water soluble contrast media. After no leak was   identified, barium contrast was administered.     FINDINGS:    . Scout: Similar low lung volumes with bibasilar atelectasis.   Nonobstructive bowel gas pattern. Surgical clips over the right upper   quadrant.  . Esophagus: No extraesophageal contrast extravasation. No stricture.   Normal distensibility, motility and contour.  . Gastroesophageal junction: No large hiatal hernia or spontaneous   gastroesophageal reflux.  . Additional comments: None.    IMPRESSION:    No evidence of contrast leak.         Transthoracic echo (TTE) complete  Final Result by Silvano Dellis Lied, MD (12/16 1543)                                                    [facility]                                                  [facility]                                                    [facility]  [address], [place].                                                      [id number]                                   Transthoracic Echocardiogram Report  Name  Sandra Mathis, Sandra Mathis           Study Date  05-21-2024                  Height  65 in  MRN  [id number]                                  Patient Location  [id number]               Weight  212 lb  DOB  [birth year]                                Gender  Female                          BSA  2.0 m2  Age  31 yrs                                    Ethnicity  1                            BP  120-58 mmHg  Reason For Study  chest pain                                                           HR  52                                                                                         Fellow  Ellouise, MD,  Ordering Physician  VINCENTE BRITTLE            Performed By  ROSENDO BUN             MD Reyes [facility]OR,                                                                                         [id number]  Referring Physician  VINCENTE BRITTLE  -  -  PROCEDURE  A complete two-dimensional transthoracic echocardiogram was performed  2D,   M-mode, spectral and  color flow Doppler . LV STRAIN PERFORMED.  -  SUMMARY  The left ventricular size is normal with normal left ventricular wall   thickness.  Left ventricular systolic function is normal.  LV ejection fraction = 60-65%.  Normal diastolic function.  The right ventricle is normal in size and function.  The left atrium is mildly dilated.  The right atrium is mildly dilated.  No significant valvular regurgitation or stenosis  Moderately dilated, non-collapsible IVC consistent with an elevated right   atrial pressure.  There is trivial pericardial effusion.  There is no significant valvular stenosis or regurgitation.  There is no comparison study available.  -  FINDINGS   LEFT VENTRICLE  The left ventricular size is normal with normal left ventricular wall    thickness. LV ejection  fraction = 60-65%. Left ventricular systolic function is normal. The left   ventricular global  longitudinal strain is normal at - 20.4%. Normal diastolic function. The   left ventricular wall  motion is normal.  -  RIGHT VENTRICLE  The right ventricle is normal in size and function.  LEFT ATRIUM  The left atrium is mildly dilated.  RIGHT ATRIUM  The right atrium is mildly dilated.  -  AORTIC VALVE  The aortic valve is trileaflet. There is no aortic stenosis. There is no   aortic regurgitation.  -  MITRAL VALVE  The mitral valve leaflets appear normal. There is trace mitral   regurgitation.  -  TRICUSPID VALVE  Structurally normal tricuspid valve. There is trace tricuspid   regurgitation.  -  PULMONIC VALVE  The pulmonic valve is not well visualized. Trace pulmonic valvular   regurgitation.  -  ARTERIES  The aortic sinus is normal size. The ascending aorta is normal size.  -  VENOUS  Pulmonary venous flow pattern is normal. Moderately dilated,   non-collapsible IVC consistent with an  elevated right atrial pressure.  -  EFFUSION  There is trivial pericardial effusion.  -  -  MMode-2D Measurements & Calculations  IVSd  0.82 cm                   LA diam  3.8 cm       asc Aorta Diam  2.9   cm LVOT diam  1.9 cm  LVIDd  5.3 cm  LVPWd  0.85 cm  LVIDs  3.5 cm              ___________________________________________________________________________  ______  LA Vol Indexed  MOD   40.8 ml-m2LAVol MOD-bp   82.7 mlLAVol MOD-sp2   85.6  mlLAVol MOD-sp4   73.9 ml              ___________________________________________________________________________  ______  Rel Wall Thickness  0.32        TAPSE  2.7 cm  Doppler Measurements & Calculations  MV E max vel  93.6 cm-sec        MV dec time  0.24 sec    SV LVOT   79.5   ml        LV V1 VTI   MV A max vel  59.2 cm-sec                                 Ao V2 max  150.9   cm-sec  28.4 cm  MV E-A  1.6                                                Ao max PG  9.1   mmHg  Med Peak E  Vel  9.9 cm-sec                               Ao V2 mean    102.6 cm-sec  Lat Peak E  Vel  12.5 cm-sec                              Ao mean PG  4.9   mmHg  E-Lat E`  7.5                                             Ao V2 VTI  37.6   cm  E-Med E`  9.5                                             AVA  VTI   2.1   cm2              ___________________________________________________________________________  ______  AS Dimensionless Index  VTI      AVAi VTI  cm^2-m^2       SV index LVOT    0.76                                   1.0 cm2                  39.2 ml-m2  ___________________________________________________________________________  ___  Reading Physician                     [place], [id number] 05-21-2024 03 43 PM    XR Chest 1 View  Final Result by Bennet Ozell Hurst, MD (12/16 1106)  XR CHEST 1 VIEW, 05/21/2024 1:23 AM    INDICATION:cp, Gastro-esophageal reflux disease with esophagitis, without   bleeding \ K21.00 Gastro-esophageal reflux disease with esophagitis,   without bleeding   COMPARISON: Chest x-ray from 09/28/2023.  FINDINGS:     Supportive devices: No imaged internal support devices.  Cardiovascular/lungs/pleura: Low lung wires. Enlarged appearance of the   cardiac silhouette compared to prior. Diffuse linear opacities. Streaky   bibasilar opacities. No large pleural effusion. No pneumothorax.  Other: No acute osseous abnormality.    IMPRESSION:  Low lung volumes compared to prior with accentuated appearance of the   cardiac silhouette, vascular crowding, and bibasilar atelectasis.   Superimposed pulmonary edema is not excluded.          Predictive Model Details        3.9% (Low)  Factor Value   Calculated 05/27/2024 12:05 12% Number of ED visits in last 90 days 0   Readmission Risk Score v2 Model 9% Number of active outpatient medication orders 16    8% Braden  score 22    8% Admission type not urgent    8% Number of hospitalizations in last year 0     Electronically signed by: Shasta Hadassah Hammed, MD 05/27/2024 2:20 PM   Time spent on discharge: 45 minutes *Some images could not be shown."

## 2024-05-29 ENCOUNTER — Encounter (HOSPITAL_COMMUNITY): Payer: Self-pay | Admitting: Emergency Medicine

## 2024-05-29 ENCOUNTER — Emergency Department (HOSPITAL_COMMUNITY)
Admission: EM | Admit: 2024-05-29 | Discharge: 2024-05-29 | Disposition: A | Discharge status: Home / Self Care | Payer: Self-pay | Attending: Emergency Medicine | Admitting: Emergency Medicine

## 2024-05-29 ENCOUNTER — Emergency Department (HOSPITAL_COMMUNITY): Payer: Self-pay

## 2024-05-29 ENCOUNTER — Other Ambulatory Visit (HOSPITAL_COMMUNITY): Payer: Self-pay

## 2024-05-29 ENCOUNTER — Other Ambulatory Visit: Payer: Self-pay

## 2024-05-29 DIAGNOSIS — Z7982 Long term (current) use of aspirin: Secondary | ICD-10-CM | POA: Insufficient documentation

## 2024-05-29 DIAGNOSIS — R519 Headache, unspecified: Secondary | ICD-10-CM | POA: Insufficient documentation

## 2024-05-29 DIAGNOSIS — R55 Syncope and collapse: Secondary | ICD-10-CM | POA: Insufficient documentation

## 2024-05-29 DIAGNOSIS — R079 Chest pain, unspecified: Secondary | ICD-10-CM | POA: Insufficient documentation

## 2024-05-29 DIAGNOSIS — J45909 Unspecified asthma, uncomplicated: Secondary | ICD-10-CM | POA: Insufficient documentation

## 2024-05-29 DIAGNOSIS — Z7951 Long term (current) use of inhaled steroids: Secondary | ICD-10-CM | POA: Insufficient documentation

## 2024-05-29 DIAGNOSIS — Z91040 Latex allergy status: Secondary | ICD-10-CM | POA: Insufficient documentation

## 2024-05-29 DIAGNOSIS — R051 Acute cough: Secondary | ICD-10-CM | POA: Insufficient documentation

## 2024-05-29 LAB — CBC
HCT: 38.6 % (ref 36.0–46.0)
Hemoglobin: 13.4 g/dL (ref 12.0–15.0)
MCH: 30.2 pg (ref 26.0–34.0)
MCHC: 34.7 g/dL (ref 30.0–36.0)
MCV: 86.9 fL (ref 80.0–100.0)
Platelets: 192 K/uL (ref 150–400)
RBC: 4.44 MIL/uL (ref 3.87–5.11)
RDW: 13.7 % (ref 11.5–15.5)
WBC: 6.1 K/uL (ref 4.0–10.5)
nRBC: 0 % (ref 0.0–0.2)

## 2024-05-29 LAB — BASIC METABOLIC PANEL WITH GFR
Anion gap: 12 (ref 5–15)
BUN: 17 mg/dL (ref 6–20)
CO2: 19 mmol/L — ABNORMAL LOW (ref 22–32)
Calcium: 8.8 mg/dL — ABNORMAL LOW (ref 8.9–10.3)
Chloride: 105 mmol/L (ref 98–111)
Creatinine, Ser: 0.88 mg/dL (ref 0.44–1.00)
GFR, Estimated: >60 mL/min
Glucose, Bld: 110 mg/dL — ABNORMAL HIGH (ref 70–99)
Potassium: 3.2 mmol/L — ABNORMAL LOW (ref 3.5–5.1)
Sodium: 136 mmol/L (ref 135–145)

## 2024-05-29 LAB — RESP PANEL BY RT-PCR (RSV, FLU A&B, COVID)  RVPGX2
Influenza A by PCR: NEGATIVE
Influenza B by PCR: NEGATIVE
Resp Syncytial Virus by PCR: NEGATIVE
SARS Coronavirus 2 by RT PCR: NEGATIVE

## 2024-05-29 LAB — HEPATIC FUNCTION PANEL
ALT: 18 U/L (ref 0–44)
AST: 27 U/L (ref 15–41)
Albumin: 4.3 g/dL (ref 3.5–5.0)
Alkaline Phosphatase: 77 U/L (ref 38–126)
Bilirubin, Direct: 0.1 mg/dL (ref 0.0–0.2)
Indirect Bilirubin: 0.2 mg/dL — ABNORMAL LOW (ref 0.3–0.9)
Total Bilirubin: 0.3 mg/dL (ref 0.0–1.2)
Total Protein: 7.8 g/dL (ref 6.5–8.1)

## 2024-05-29 LAB — TROPONIN T, HIGH SENSITIVITY
Troponin T High Sensitivity: <15 ng/L (ref 0–19)
Troponin T High Sensitivity: <15 ng/L (ref 0–19)

## 2024-05-29 MED ORDER — PREDNISONE 20 MG PO TABS
40.0000 mg | ORAL_TABLET | Freq: Every day | ORAL | 0 refills | Status: AC
  Filled 2024-05-29 (×2): qty 6, 3d supply, fill #0

## 2024-05-29 MED ORDER — METHYLPREDNISOLONE SODIUM SUCC 40 MG IJ SOLR
40.0000 mg | Freq: Once | INTRAMUSCULAR | Status: AC
  Administered 2024-05-29: 40 mg via INTRAVENOUS
  Filled 2024-05-29: qty 1

## 2024-05-29 MED ORDER — IOHEXOL 350 MG/ML SOLN
75.0000 mL | Freq: Once | INTRAVENOUS | Status: AC | PRN
  Administered 2024-05-29: 75 mL via INTRAVENOUS

## 2024-05-29 MED ORDER — DIPHENHYDRAMINE HCL 50 MG/ML IJ SOLN: 50.0000 mg | Freq: Once | INTRAMUSCULAR | Status: AC

## 2024-05-29 MED ORDER — LACTATED RINGERS IV BOLUS
500.0000 mL | Freq: Once | INTRAVENOUS | Status: AC
  Administered 2024-05-29: 500 mL via INTRAVENOUS

## 2024-05-29 MED ORDER — METOCLOPRAMIDE HCL 5 MG/ML IJ SOLN
10.0000 mg | Freq: Once | INTRAMUSCULAR | Status: AC
  Administered 2024-05-29: 10 mg via INTRAVENOUS
  Filled 2024-05-29: qty 2

## 2024-05-29 MED ORDER — DIPHENHYDRAMINE HCL 25 MG PO CAPS
50.0000 mg | ORAL_CAPSULE | Freq: Once | ORAL | Status: AC
  Administered 2024-05-29: 50 mg via ORAL
  Filled 2024-05-29: qty 2

## 2024-05-29 NOTE — ED Triage Notes (Signed)
 [facility] for [facility], pt states she still having cp, increase SOB and persistent cough.

## 2024-05-29 NOTE — ED Notes (Signed)
 Pt ambulatory to the restroom with steady gait.

## 2024-05-29 NOTE — ED Notes (Signed)
 Patient transported to CT

## 2024-05-29 NOTE — ED Provider Notes (Signed)
 " [place] EMERGENCY DEPARTMENT AT [facility] Provider Note   CSN: [id number] Arrival date & time: 05/29/24  9571     Patient presents with: Chest Pain   Sandra Mathis is a 48 y.o. female.  Chest Pain Associated symptoms: cough and shortness of breath   Patient is a 48 year old female presenting ED today for concerns for chest pain, shortness of breath, near syncope, cough and wheezing that has been ongoing x 1 day after being discharged from hospital.  She was treated for pericarditis following a hiatal hernia repair.  Placed on colchicine.  Was treated with 7 days of Ancef while in hospital.  Discharged on Zio patch.  Presented to ED today for concerns for persistent near syncope, cough, shortness of breath, chest pain concerned that she may have developed recurrent pericarditis versus PE.  Noted to have exertional chest pain.  Only able to go 75-100 steps without becoming short of breath.   Denies fever, headache, vision changes, hemoptysis, congestion, abdominal pain, nausea, vomiting, diarrhea, dysuria, melena, hematochezia, lower leg swelling.  Prior to Admission medications  Medication Sig Start Date End Date Taking? Authorizing Provider  predniSONE  (DELTASONE ) 20 MG tablet Take 2 tablets (40 mg total) by mouth daily for 3 days. 05/29/24 06/01/24 Yes Beola Terrall RAMAN, PA-C  albuterol  (PROVENTIL ) (2.5 MG/3ML) 0.083% nebulizer solution Take 2.5 mg by nebulization every 6 (six) hours as needed. 06/13/19   [provider]  albuterol  (VENTOLIN  HFA) 108 (90 Base) MCG/ACT inhaler Inhale 2 puffs into the lungs every 6 (six) hours as needed for wheezing or shortness of breath.    [provider]  aspirin 81 MG chewable tablet Chew 81 mg by mouth daily.    [provider]  cetirizine (ZYRTEC) 10 MG tablet Take 10 mg by mouth daily.    [provider]  diphenhydrAMINE  (BENADRYL ) 25 mg capsule Take 25 mg by mouth every 6 (six) hours as needed.     [provider]  escitalopram  (LEXAPRO ) 10 MG tablet Take 1 tablet (10 mg total) by mouth daily. 04/06/23     famotidine (PEPCID) 20 MG tablet Take 20 mg by mouth 2 (two) times daily. 07/28/21   [provider]  gabapentin  (NEURONTIN ) 100 MG capsule Take 1 capsule (100 mg total) by mouth 3 (three) times daily. 03/27/24     midodrine  (PROAMATINE ) 2.5 MG tablet Take 1 tablet (2.5 mg total) by mouth daily. 07/27/23     predniSONE  (STERAPRED UNI-PAK 21 TAB) 5 MG (21) TBPK tablet Take as directed on package for 6 days. 12/20/23     RABEprazole  (ACIPHEX ) 20 MG tablet Take 1 tablet (20 mg total) by mouth 2 (two) times daily. 12/25/23     SUMAtriptan  (IMITREX ) 100 MG tablet Take 1 tablet (100 mg total) by mouth once as needed for up to 1 dose for migraine. May repeat in 2 hours if headache persists or recurs. 04/12/23   Leigh Venetia CROME, MD  topiramate  (TOPAMAX ) 25 MG tablet Take 3 tablets (75 mg total) by mouth daily. 11/29/23   Leigh Venetia CROME, MD  traZODone (DESYREL) 50 MG tablet Take 50 mg by mouth at bedtime as needed for sleep. 04/03/20   [provider]    Allergies: Iodine, Latex, Lithium, Morphine and codeine, Oxycontin [oxycodone], Penicillins, Percocet [oxycodone-acetaminophen ], Pineapple flavoring agent (non-screening), Shellfish allergy, Sulfa antibiotics, Tramadol, Adhesive [tape], Celebrex [celecoxib], Codeine, Doxycycline, Doxycycline, Iodine, Lithium, Lyrica [pregabalin], Morphine and codeine, Penicillins, Percocet [oxycodone-acetaminophen ], Singulair [montelukast sodium], Singulair [montelukast], Sulfa antibiotics,  and Adhesive [tape]    Review of Systems  Respiratory:  Positive for cough and shortness of breath.   Cardiovascular:  Positive for chest pain.  All other systems reviewed and are negative.   Updated Vital Signs BP (!) 101/55 (BP Location: Right Arm)   Pulse (!) 57   Temp 98.3 F (36.8 C) (Oral)   Resp 18   Ht 5' 5 (1.651 m)   Wt 88 kg   SpO2 99%    BMI 32.28 kg/m   Physical Exam Vitals and nursing note reviewed.  Constitutional:      General: She is not in acute distress.    Appearance: Normal appearance. She is not ill-appearing or diaphoretic.  HENT:     Head: Normocephalic and atraumatic.  Eyes:     General: No scleral icterus.       Right eye: No discharge.        Left eye: No discharge.     Extraocular Movements: Extraocular movements intact.     Conjunctiva/sclera: Conjunctivae normal.  Cardiovascular:     Rate and Rhythm: Normal rate and regular rhythm.     Pulses: Normal pulses.     Heart sounds: Normal heart sounds. No murmur heard.    No friction rub. No gallop.  Pulmonary:     Effort: Pulmonary effort is normal. No respiratory distress.     Breath sounds: No stridor. No wheezing, rhonchi or rales.  Chest:     Chest wall: No tenderness.  Abdominal:     General: Abdomen is flat. There is no distension.     Palpations: Abdomen is soft.     Tenderness: There is no abdominal tenderness. There is no right CVA tenderness, left CVA tenderness, guarding or rebound.  Musculoskeletal:        General: No swelling, deformity or signs of injury.     Cervical back: Normal range of motion. No rigidity.     Right lower leg: No edema.     Left lower leg: No edema.  Skin:    General: Skin is warm and dry.     Findings: No bruising, erythema or lesion.  Neurological:     General: No focal deficit present.     Mental Status: She is alert and oriented to person, place, and time. Mental status is at baseline.     Sensory: No sensory deficit.     Motor: No weakness.  Psychiatric:        Mood and Affect: Mood normal.     (all labs ordered are listed, but only abnormal results are displayed) Labs Reviewed  BASIC METABOLIC PANEL WITH GFR - Abnormal; Notable for the following components:      Result Value   Potassium 3.2 (*)    CO2 19 (*)    Glucose, Bld 110 (*)    Calcium 8.8 (*)    All other components within normal  limits  HEPATIC FUNCTION PANEL - Abnormal; Notable for the following components:   Indirect Bilirubin 0.2 (*)    All other components within normal limits  RESP PANEL BY RT-PCR (RSV, FLU A&B, COVID)  RVPGX2  CBC  TROPONIN T, HIGH SENSITIVITY  TROPONIN T, HIGH SENSITIVITY    EKG: EKG Interpretation Date/Time:  Wednesday May 29 2024 05:24:38 EST Ventricular Rate:  76 PR Interval:  174 QRS Duration:  82 QT Interval:  388 QTC Calculation: 436 R Axis:   59  Text Interpretation: Normal sinus rhythm Cannot rule out Anterior infarct , age  undetermined Abnormal ECG When compared with ECG of 10-Apr-2016 14:47, HEART RATE has decreased Confirmed by Raford Lenis ([id number]) on 05/29/2024 6:33:31 AM  Radiology: CT Angio Chest PE W and/or Wo Contrast Result Date: 05/29/2024 EXAM: CTA CHEST 05/29/2024 12:01:00 PM TECHNIQUE: CTA of the chest was performed without and with the administration of 75 mL of iohexol  (OMNIPAQUE ) 350 MG/ML injection. Multiplanar reformatted images are provided for review. MIP images are provided for review. Automated exposure control, iterative reconstruction, and/or weight based adjustment of the mA/kV was utilized to reduce the radiation dose to as low as reasonably achievable. COMPARISON: 05/29/2024, 06/21/2023 CLINICAL HISTORY: Pulmonary embolism (PE) suspected, high prob. FINDINGS: PULMONARY ARTERIES: Pulmonary arteries are adequately opacified for evaluation. No acute pulmonary embolus. Main pulmonary artery is normal in caliber. MEDIASTINUM: Small volume pericardial effusion. There is no acute abnormality of the thoracic aorta. LYMPH NODES: No mediastinal, hilar or axillary lymphadenopathy. LUNGS AND PLEURA: Trace pleural effusions bilaterally with compressive atelectasis. Minimal fibrolinear scarring in the right middle lobe, unchanged. No pneumothorax. UPPER ABDOMEN: Partially visualized upper abdomen demonstrates no acute abnormality. 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 No acute bone abnormality. IMPRESSION: 1. No pulmonary embolism. 2. Trace pleural effusions bilaterally with compressive atelectasis. No pneumonia or pulmonary edema. Electronically signed by: Rogelia Myers MD 05/29/2024 12:32 PM EST RP Workstation: [id number]   DG Chest 2 View Result Date: 05/29/2024 EXAM: 2 VIEW(S) XRAY OF THE CHEST 05/29/2024 05:31:14 AM COMPARISON: Chest PA and lateral 09/28/2023. CLINICAL HISTORY: Chest pain. FINDINGS: LINES, TUBES AND DEVICES: A small electronic device overlies the anterior upper left chest, not seen previously. LUNGS AND PLEURA: No focal pulmonary opacity. No pleural effusion. No pneumothorax. HEART AND MEDIASTINUM: No acute abnormality of the cardiac and mediastinal silhouettes. BONES AND SOFT TISSUES: There is mild thoracic kyphosis with a slight dextroscoliosis. No acute osseous abnormality. IMPRESSION: 1. No acute cardiopulmonary abnormality. Electronically signed by: Francis Quam MD 05/29/2024 05:42 AM EST RP Workstation: [id number]    Procedures   Medications Ordered in the ED  methylPREDNISolone  sodium succinate (SOLU-MEDROL ) 40 mg/mL injection 40 mg (40 mg Intravenous Given 05/29/24 0754)  diphenhydrAMINE  (BENADRYL ) capsule 50 mg (50 mg Oral Given 05/29/24 1103)    Or  diphenhydrAMINE  (BENADRYL ) injection 50 mg ( Intravenous See Alternative 05/29/24 1103)  lactated ringers  bolus 500 mL (500 mLs Intravenous New Bag/Given 05/29/24 1217)  metoCLOPramide  (REGLAN ) injection 10 mg (10 mg Intravenous Given 05/29/24 1214)  iohexol  (OMNIPAQUE ) 350 MG/ML injection 75 mL (75 mLs Intravenous Contrast Given 05/29/24 1200)   Medical Decision Making Amount and/or Complexity of Data Reviewed Labs: ordered. Radiology: ordered.  Risk Prescription drug management.   This patient is a 48 year old female who presents to the ED for concern of chest pain, shortness of breath, cough, near syncope  has been ongoing since yesterday, noted to have been discharged in the hospital 2 days ago after being treated for pericarditis after hiatal hernia repair.  Notably had many consults including stress testing done which were negative.  Has outpatient follow-ups with cardiology, rheumatology, PCP.  On physical exam, patient is in no acute distress, afebrile, alert and orient x 4, speaking in full sentences, nontachypneic, nontachycardic.  LCTAB, RRR, no murmur.  Notably does not have any abdominal tenderness to palpation.  No lower leg edema.  Unremarkable exam otherwise.  With patient's recent near syncopal episode and concerns for shortness of breath and some pleuritic chest pain, CT imaging was ordered.  Lab work was  unremarkable with troponins undetectable.  CT scan did not show any acute findings and no signs of PE.  Low suspicion for any other emergent cause of her symptoms today.  Will have her continue to monitor symptoms and follow-up with PCP, cardiology, rheumatology.  Will send in a short course of prednisone  for patient's acute cough today as she is said that she is also pending using her albuterol  inhaler.  No signs of consolidations or concerns for pneumonia.  Patient also noted that she had a mild headache similar to her previous migraines.  Provided Reglan  and fluids with improvement of symptoms.  Patient vital signs have remained stable throughout the course of patient's time in the ED. Low suspicion for any other emergent pathology at this time. I believe this patient is safe to be discharged. Provided strict return to ER precautions. Patient expressed agreement and understanding of plan. All questions were answered.  Spoke to patient at length for iodine allergy with CT PE study. Pt states that she understands the risks and is still wishing to undergo PE study despite risk of anaphylaxis and death.   Differential diagnoses prior to evaluation: The emergent differential diagnosis  includes, but is not limited to, ACS, PE, pericarditis, pleural effusions, pneumothorax, anxiety, GERD, AAS. This is not an exhaustive differential.   Past Medical History / Co-morbidities / Social History: POTS on midodrine , lupus, asthma, rheumatoid arthritis, GERD  Additional history: Chart reviewed. Pertinent results include:   Recently discharged on 05/27/2024 Noted to have a negative stress test during hospital stay without ischemia.  Noted to have negative esophagram.  Notably did have transoral incision lesion fundoplication on 05/20/2024.  Diagnosed with pericarditis and started on colchicine.  Was to be discharged on Zio patch, antibiotics, pain control  Additionally noted to have both cardiology, gastrology and neurology consulted during her hospital admission.  Neurology was consulted secondary to patient is transient awareness.  New hallucinations.  Recommending outpatient follow-up.  GI manage postprocedural care confirming no leak.  Noted to have intermittent headaches and vomiting attributed migraines managed with antiemetics.  DVT prophylaxis was provided.  And several referrals were arranged including cardiology, neurology and rheumatology.    Lab Tests/Imaging studies: I personally interpreted labs/imaging and the pertinent results include:    CBC unremarkable CMP shows a mild hypokalemia and a mild hypocalcemia but otherwise unremarkable Hepatic function panel unremarkable Respiratory panel unremarkable Troponin and delta troponin unremarkable Chest x-ray shows no acute cardiopulmonary process CT angiogram shows no PE does show some trace pleural effusions with compressive atelectasis.  Otherwise unremarkable.  I agree with the radiologist interpretation.  Cardiac monitoring: EKG obtained and interpreted by myself and attending physician which shows: NSR  EKG Interpretation Date/Time:  Wednesday May 29 2024 05:24:38 EST Ventricular Rate:  76 PR  Interval:  174 QRS Duration:  82 QT Interval:  388 QTC Calculation: 436 R Axis:   59  Text Interpretation: Normal sinus rhythm Cannot rule out Anterior infarct , age undetermined Abnormal ECG When compared with ECG of 10-Apr-2016 14:47, HEART RATE has decreased Confirmed by Raford Lenis ([id number]) on 05/29/2024 6:33:31 AM          Medications: I ordered medication including LR, Reglan , prednisone , Benadryl .  I have reviewed the patients home medicines and have made adjustments as needed.  Critical Interventions: None  Social Determinants of Health: Has good follow-up with cardiology, PCP.  Disposition: After consideration of the diagnostic results and the patients response to treatment, I feel that the patient would  benefit from discharge and treatment as above.   emergency department workup does not suggest an emergent condition requiring admission or immediate intervention beyond what has been performed at this time. The plan is: Follow-up with PCP, follow-up cardiology, follow-up with rheumatology, prednisone  for cough, albuterol  for wheezing, return to the ER for new or worsening symptoms. The patient is safe for discharge and has been instructed to return immediately for worsening symptoms, change in symptoms or any other concerns.  Final diagnoses:  Chest pain, unspecified type  Acute cough  Near syncope  Acute nonintractable headache, unspecified headache type    ED Discharge Orders          Ordered    predniSONE  (DELTASONE ) 20 MG tablet  Daily        05/29/24 1303               Beola Terrall RAMAN, PA-C 05/29/24 1310    Cottie Donnice PARAS, MD 05/29/24 1532  "

## 2024-05-29 NOTE — Discharge Instructions (Addendum)
 He was seen today for chest pain, shortness of breath, acute cough.  Your lab work and physical exam today were very reassuring that I low suspicion for any emergent cause recent today.  Your CT scan also was very reassuring.  Recommend continued follow-up with your cardiologist, rheumatologist and PCP.  Sending in a short course of steroids for you to use over the next few days to help with the cough.  Continue to use albuterol  inhaler as needed for wheezing.  Return to the ER if you been having new or worsening symptoms which include fever, shortness of breath at rest, worsening chest pain, blood in cough, nausea and vomiting.

## 2024-05-31 ENCOUNTER — Ambulatory Visit: Admitting: Neurology

## 2024-06-08 ENCOUNTER — Emergency Department (HOSPITAL_COMMUNITY): Payer: Self-pay

## 2024-06-08 ENCOUNTER — Emergency Department (HOSPITAL_COMMUNITY)
Admission: EM | Admit: 2024-06-08 | Discharge: 2024-06-08 | Disposition: A | Discharge status: Home / Self Care | Payer: Self-pay | Source: Home / Self Care | Attending: Emergency Medicine | Admitting: Emergency Medicine

## 2024-06-08 ENCOUNTER — Other Ambulatory Visit: Payer: Self-pay

## 2024-06-08 ENCOUNTER — Encounter (HOSPITAL_COMMUNITY): Payer: Self-pay

## 2024-06-08 DIAGNOSIS — M7989 Other specified soft tissue disorders: Secondary | ICD-10-CM | POA: Insufficient documentation

## 2024-06-08 DIAGNOSIS — R0789 Other chest pain: Secondary | ICD-10-CM | POA: Insufficient documentation

## 2024-06-08 DIAGNOSIS — Z7982 Long term (current) use of aspirin: Secondary | ICD-10-CM | POA: Insufficient documentation

## 2024-06-08 DIAGNOSIS — R0600 Dyspnea, unspecified: Secondary | ICD-10-CM | POA: Insufficient documentation

## 2024-06-08 DIAGNOSIS — R059 Cough, unspecified: Secondary | ICD-10-CM | POA: Insufficient documentation

## 2024-06-08 DIAGNOSIS — M549 Dorsalgia, unspecified: Secondary | ICD-10-CM | POA: Insufficient documentation

## 2024-06-08 DIAGNOSIS — Z91040 Latex allergy status: Secondary | ICD-10-CM | POA: Insufficient documentation

## 2024-06-08 LAB — BASIC METABOLIC PANEL WITH GFR
Anion gap: 12 (ref 5–15)
BUN: 15 mg/dL (ref 6–20)
CO2: 18 mmol/L — ABNORMAL LOW (ref 22–32)
Calcium: 9 mg/dL (ref 8.9–10.3)
Chloride: 108 mmol/L (ref 98–111)
Creatinine, Ser: 0.87 mg/dL (ref 0.44–1.00)
GFR, Estimated: >60 mL/min
Glucose, Bld: 103 mg/dL — ABNORMAL HIGH (ref 70–99)
Potassium: 3.7 mmol/L (ref 3.5–5.1)
Sodium: 139 mmol/L (ref 135–145)

## 2024-06-08 LAB — D-DIMER, QUANTITATIVE: D-Dimer, Quant: 3.54 ug{FEU}/mL — ABNORMAL HIGH (ref 0.00–0.50)

## 2024-06-08 LAB — LIPASE, BLOOD: Lipase: 43 U/L (ref 11–51)

## 2024-06-08 LAB — CBC
HCT: 39 % (ref 36.0–46.0)
Hemoglobin: 13.4 g/dL (ref 12.0–15.0)
MCH: 29.9 pg (ref 26.0–34.0)
MCHC: 34.4 g/dL (ref 30.0–36.0)
MCV: 87.1 fL (ref 80.0–100.0)
Platelets: 195 K/uL (ref 150–400)
RBC: 4.48 MIL/uL (ref 3.87–5.11)
RDW: 13.8 % (ref 11.5–15.5)
WBC: 6.7 K/uL (ref 4.0–10.5)
nRBC: 0 % (ref 0.0–0.2)

## 2024-06-08 LAB — HEPATIC FUNCTION PANEL
ALT: 27 U/L (ref 0–44)
AST: 27 U/L (ref 15–41)
Albumin: 4.2 g/dL (ref 3.5–5.0)
Alkaline Phosphatase: 81 U/L (ref 38–126)
Bilirubin, Direct: 0.1 mg/dL (ref 0.0–0.2)
Indirect Bilirubin: 0.2 mg/dL — ABNORMAL LOW (ref 0.3–0.9)
Total Bilirubin: 0.4 mg/dL (ref 0.0–1.2)
Total Protein: 7.4 g/dL (ref 6.5–8.1)

## 2024-06-08 LAB — HCG, SERUM, QUALITATIVE: Preg, Serum: NEGATIVE

## 2024-06-08 LAB — TROPONIN T, HIGH SENSITIVITY
Troponin T High Sensitivity: <15 ng/L (ref 0–19)
Troponin T High Sensitivity: <15 ng/L (ref 0–19)

## 2024-06-08 LAB — MAGNESIUM: Magnesium: 2.1 mg/dL (ref 1.7–2.4)

## 2024-06-08 MED ORDER — IOHEXOL 350 MG/ML SOLN
75.0000 mL | Freq: Once | INTRAVENOUS | Status: AC | PRN
  Administered 2024-06-08: 75 mL via INTRAVENOUS

## 2024-06-08 MED ORDER — DIPHENHYDRAMINE HCL 50 MG/ML IJ SOLN
50.0000 mg | Freq: Once | INTRAMUSCULAR | Status: AC | PRN
  Administered 2024-06-08: 50 mg via INTRAVENOUS
  Filled 2024-06-08: qty 1

## 2024-06-08 MED ORDER — DIPHENHYDRAMINE HCL 50 MG/ML IJ SOLN: 50.0000 mg | Freq: Once | INTRAMUSCULAR | Status: AC

## 2024-06-08 MED ORDER — METHYLPREDNISOLONE SODIUM SUCC 40 MG IJ SOLR
40.0000 mg | Freq: Once | INTRAMUSCULAR | Status: AC
  Administered 2024-06-08: 40 mg via INTRAVENOUS
  Filled 2024-06-08: qty 1

## 2024-06-08 MED ORDER — DIPHENHYDRAMINE HCL 25 MG PO CAPS
50.0000 mg | ORAL_CAPSULE | Freq: Once | ORAL | Status: AC
  Administered 2024-06-08: 50 mg via ORAL
  Filled 2024-06-08: qty 2

## 2024-06-08 NOTE — ED Provider Notes (Signed)
" ° °  ED Course / MDM   Clinical Course as of 06/08/24 2019  Sat Jun 08, 2024  1525 Received sign out from Dr. Bari pending CT PE scan. Presenting with chest pain and shortness of breath. D-dimer elevated [WS]  2016 CTA chest without evidence of acute process.  Trace bilateral pleural effusions stable from prior.  Patient is concerned about whether or not she has CHF but does not appear volume overloaded.  Reviewed outside records show reassuring echocardiogram within the last month.  She does have outpatient follow-up with her cardiologist and next few weeks.  Blood pressure is borderline low but seems stable from recent ER visit in prior visits.  She has POTS and is on chronic midodrine  therapy.  vitals are otherwise reassuring.  Feel she is stable for discharge.  Will discharge patient to home with outpatient follow-up. [WS]    Clinical Course User Index [WS] Francesca Elsie CROME, MD   Medical Decision Making Amount and/or Complexity of Data Reviewed Labs: ordered. Radiology: ordered.  Risk Prescription drug management.        Francesca Elsie CROME, MD 06/08/24 2019  "

## 2024-06-08 NOTE — ED Provider Notes (Signed)
 " [place] EMERGENCY DEPARTMENT AT [facility] Provider Note   CSN: [id number] Arrival date & time: 06/08/24  1105     Patient presents with: Chest Pain and Shortness of Breath   Sandra Mathis is a 49 y.o. female.   HPI   49 year old female with recent admission for acid reflux/pericarditis presents to the emergency department after an episode of a coughing spell.  Patient states that she had been doing well since being discharged.  However she was awoken from sleep with concern for chest pain that did feel like indigestion but it was associated with a profuse dry coughing spell.  Following the coughing spell she now has soreness in her upper back.  She has had very minimal swelling in her bilateral lower extremities but admits that she is just getting back to her physical routine since being discharged.  At my time of evaluation she has no active chest pain.  There has been no hemoptysis.  Her main concern is that she is developing early onset heart failure as this runs in her family.  She does have scheduled outpatient follow-up with cardiology in 3 weeks.  Prior to Admission medications  Medication Sig Start Date End Date Taking? Authorizing Provider  albuterol  (PROVENTIL ) (2.5 MG/3ML) 0.083% nebulizer solution Take 2.5 mg by nebulization every 6 (six) hours as needed. 06/13/19   [provider]  albuterol  (VENTOLIN  HFA) 108 (90 Base) MCG/ACT inhaler Inhale 2 puffs into the lungs every 6 (six) hours as needed for wheezing or shortness of breath.    [provider]  aspirin 81 MG chewable tablet Chew 81 mg by mouth daily.    [provider]  cetirizine (ZYRTEC) 10 MG tablet Take 10 mg by mouth daily.    [provider]  diphenhydrAMINE  (BENADRYL ) 25 mg capsule Take 25 mg by mouth every 6 (six) hours as needed.    [provider]  escitalopram  (LEXAPRO ) 10 MG tablet Take 1 tablet (10 mg total) by mouth daily. 04/06/23     famotidine  (PEPCID) 20 MG tablet Take 20 mg by mouth 2 (two) times daily. 07/28/21   [provider]  gabapentin  (NEURONTIN ) 100 MG capsule Take 1 capsule (100 mg total) by mouth 3 (three) times daily. 03/27/24     midodrine  (PROAMATINE ) 2.5 MG tablet Take 1 tablet (2.5 mg total) by mouth daily. 07/27/23     predniSONE  (STERAPRED UNI-PAK 21 TAB) 5 MG (21) TBPK tablet Take as directed on package for 6 days. 12/20/23     RABEprazole  (ACIPHEX ) 20 MG tablet Take 1 tablet (20 mg total) by mouth 2 (two) times daily. 12/25/23     SUMAtriptan  (IMITREX ) 100 MG tablet Take 1 tablet (100 mg total) by mouth once as needed for up to 1 dose for migraine. May repeat in 2 hours if headache persists or recurs. 04/12/23   Leigh Venetia CROME, MD  topiramate  (TOPAMAX ) 25 MG tablet Take 3 tablets (75 mg total) by mouth daily. 11/29/23   Leigh Venetia CROME, MD  traZODone (DESYREL) 50 MG tablet Take 50 mg by mouth at bedtime as needed for sleep. 04/03/20   [provider]    Allergies: Iodine, Latex, Lithium, Morphine and codeine, Oxycontin [oxycodone], Penicillins, Percocet [oxycodone-acetaminophen ], Pineapple flavoring agent (non-screening), Shellfish allergy, Sulfa antibiotics, Tramadol, Adhesive [tape], Celebrex [celecoxib], Codeine, Doxycycline, Doxycycline, Iodine, Lithium, Lyrica [pregabalin], Morphine and codeine, Penicillins, Percocet [oxycodone-acetaminophen ], Singulair [montelukast sodium], Singulair [montelukast], Sulfa antibiotics, and Adhesive [tape]    Review of Systems  Constitutional:  Positive for fatigue. Negative for fever.  Respiratory:  Positive for cough and shortness of breath.   Cardiovascular:  Positive for chest pain and leg swelling.  Gastrointestinal:  Negative for abdominal pain, diarrhea and vomiting.  Skin:  Negative for rash.  Neurological:  Negative for headaches.    Updated Vital Signs BP 92/67   Pulse 69   Temp 97.7 F (36.5 C) (Oral)   Resp 18   Ht 5' 5 (1.651 m)   Wt 88 kg   LMP  04/08/2016   SpO2 100%   BMI 32.28 kg/m   Physical Exam Vitals and nursing note reviewed.  Constitutional:      General: She is not in acute distress.    Appearance: Normal appearance. She is not ill-appearing.  HENT:     Head: Normocephalic.     Mouth/Throat:     Mouth: Mucous membranes are moist.  Cardiovascular:     Rate and Rhythm: Normal rate.  Pulmonary:     Effort: Pulmonary effort is normal. No respiratory distress.  Abdominal:     Palpations: Abdomen is soft.     Tenderness: There is no abdominal tenderness.  Musculoskeletal:     Right lower leg: No edema.     Left lower leg: No edema.  Skin:    General: Skin is warm.  Neurological:     Mental Status: She is alert and oriented to person, place, and time. Mental status is at baseline.  Psychiatric:        Mood and Affect: Mood normal.     (all labs ordered are listed, but only abnormal results are displayed) Labs Reviewed  BASIC METABOLIC PANEL WITH GFR - Abnormal; Notable for the following components:      Result Value   CO2 18 (*)    Glucose, Bld 103 (*)    All other components within normal limits  HEPATIC FUNCTION PANEL - Abnormal; Notable for the following components:   Indirect Bilirubin 0.2 (*)    All other components within normal limits  CBC  HCG, SERUM, QUALITATIVE  MAGNESIUM  LIPASE, BLOOD  D-DIMER, QUANTITATIVE  TROPONIN T, HIGH SENSITIVITY  TROPONIN T, HIGH SENSITIVITY    EKG: EKG Interpretation Date/Time:  Saturday June 08 2024 11:17:06 EST Ventricular Rate:  63 PR Interval:  174 QRS Duration:  80 QT Interval:  422 QTC Calculation: 431 R Axis:   24  Text Interpretation: Normal sinus rhythm Possible Anterior infarct , age undetermined No significant change since last tracing Confirmed by [name] [id number]) on 06/08/2024 11:34:40 AM  Radiology: [facility] Chest 2 View Result Date: 06/08/2024 EXAM: 2 VIEW(S) XRAY OF THE CHEST 06/08/2024 12:33:00 PM COMPARISON: 05/29/2024 CLINICAL HISTORY:  SOB CP FINDINGS: LINES, TUBES AND DEVICES: A monitor device overlies the left anterior chest wall. LUNGS AND PLEURA: No focal pulmonary opacity. No pleural effusion. No pneumothorax. HEART AND MEDIASTINUM: No acute abnormality of the cardiac and mediastinal silhouettes. BONES AND SOFT TISSUES: No acute osseous abnormality. IMPRESSION: 1. No acute process. Electronically signed by: Waddell Calk MD 06/08/2024 12:50 PM EST RP Workstation: [id number]     Procedures   Medications Ordered in the ED - No data to display                                  Medical Decision Making Amount and/or Complexity of Data Reviewed Labs: ordered. Radiology: ordered.  Risk Prescription drug management.  49 year old female presents emergency department after an episode of chest pain that woke her up from sleep and a coughing spell now with back pain and mild leg swelling.  Patient was recently admitted for chest pain secondary to pericarditis.  Has scheduled outpatient follow-up with cardiology.  Vitals are normal and stable on arrival.  EKG looks unchanged for the patient.  Initial blood work is reassuring, troponin is negative.  Chest x-ray does not show any acute findings.  Given the recent hospitalization and concern for possible PE a D-dimer was done which was elevated over 3.  Patient will require CT PE study.  Has mild iodine allergy listed but does well with pretreatment for contrast allergy.  This was done recently a couple weeks ago without any difficulty.  Contrast allergy pretreatment has been ordered.  Plan for CT PE study and further evaluation and treatment.  Patient signed out pending imaging.     Final diagnoses:  None    ED Discharge Orders     None          Bari Roxie HERO, DO 06/08/24 1440  "

## 2024-06-08 NOTE — ED Triage Notes (Signed)
 Pt bib ems from home c/o cp that started around 8 am.  Midsternum radiate to back to shoulder blades Pt wearing holter monitor. Pt dx pericarditis last month.  Extensive cardiac hx  Hx HTN   BP 88/55 (Pt trends in high 90's) Midodrine  CBG 89 RA 98% HR 70

## 2024-06-08 NOTE — Discharge Instructions (Addendum)
 We evaluated you for your shortness of breath.  Your testing in the emergency department was reassuring including your CT scan and cardiac enzyme testing.  Please follow-up with your primary doctor and your cardiologist.  If you have any new or worsening symptoms, please return to the emergency department.

## 2024-06-08 NOTE — ED Provider Triage Note (Signed)
 Emergency Medicine Provider Triage Evaluation Note  Sandra Mathis , a 49 y.o. female  was evaluated in triage.  Pt complains of chest pain.  Review of Systems  Positive: Chest pain, shortness of breath Negative: Headache, weakness, numbness, nausea  Physical Exam  BP 92/67   Pulse 69   Temp 97.7 F (36.5 C) (Oral)   Resp 18   Ht 5' 5 (1.651 m)   Wt 88 kg   LMP 04/08/2016   SpO2 100%   BMI 32.28 kg/m  Gen:   Awake, no distress   Resp:  Normal effort  MSK:   Moves extremities without difficulty, no chest tenderness  Medical Decision Making  Medically screening exam initiated at 11:20 AM.  Appropriate orders placed.  Sandra Mathis was informed that the remainder of the evaluation will be completed by another provider, this initial triage assessment does not replace that evaluation, and the importance of remaining in the ED until their evaluation is complete.  Patient presenting for chest pain.  Recent history includes admission to [facility] 2 weeks ago for fundoplication procedure.  She had postoperative substernal chest pain.  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  She was started on colchicine.  NSAIDs were avoided due to recent fundoplication procedure.  She was discharged on 12/22.  She was seen in the ED here 2 days later for ongoing chest pain.  Workup was negative at that time.  She was discharged from the ED and prescribed prednisone .  She completed her prednisone .  She remains on colchicine twice daily.  She did have resolution of her chest pain up until this morning.  This morning, she woke up with recurrence of substernal chest pain.  This is worse with recumbent positioning.  Had associated shortness of breath.   Sandra Motto, MD 06/08/24 1128

## 2024-07-11 ENCOUNTER — Encounter (HOSPITAL_BASED_OUTPATIENT_CLINIC_OR_DEPARTMENT_OTHER): Payer: Self-pay

## 2024-07-11 ENCOUNTER — Other Ambulatory Visit: Payer: Self-pay

## 2024-07-11 ENCOUNTER — Emergency Department (HOSPITAL_BASED_OUTPATIENT_CLINIC_OR_DEPARTMENT_OTHER)
Admission: EM | Admit: 2024-07-11 | Discharge: 2024-07-11 | Disposition: A | Discharge status: Home / Self Care | Payer: Self-pay | Source: Home / Self Care | Attending: Emergency Medicine | Admitting: Emergency Medicine

## 2024-07-11 DIAGNOSIS — M549 Dorsalgia, unspecified: Secondary | ICD-10-CM

## 2024-07-11 LAB — COMPREHENSIVE METABOLIC PANEL WITH GFR
ALT: 41 U/L (ref 0–44)
AST: 38 U/L (ref 15–41)
Albumin: 4.2 g/dL (ref 3.5–5.0)
Alkaline Phosphatase: 78 U/L (ref 38–126)
Anion gap: 12 (ref 5–15)
BUN: 14 mg/dL (ref 6–20)
CO2: 21 mmol/L — ABNORMAL LOW (ref 22–32)
Calcium: 9.5 mg/dL (ref 8.9–10.3)
Chloride: 105 mmol/L (ref 98–111)
Creatinine, Ser: 0.95 mg/dL (ref 0.44–1.00)
GFR, Estimated: >60 mL/min
Glucose, Bld: 93 mg/dL (ref 70–99)
Potassium: 3.7 mmol/L (ref 3.5–5.1)
Sodium: 138 mmol/L (ref 135–145)
Total Bilirubin: 0.5 mg/dL (ref 0.0–1.2)
Total Protein: 7.4 g/dL (ref 6.5–8.1)

## 2024-07-11 LAB — CBC WITH DIFFERENTIAL/PLATELET
Abs Immature Granulocytes: 0.03 10*3/uL (ref 0.00–0.07)
Basophils Absolute: 0 10*3/uL (ref 0.0–0.1)
Basophils Relative: 1 %
Eosinophils Absolute: 0.3 10*3/uL (ref 0.0–0.5)
Eosinophils Relative: 5 %
HCT: 39.1 % (ref 36.0–46.0)
Hemoglobin: 13.5 g/dL (ref 12.0–15.0)
Immature Granulocytes: 1 %
Lymphocytes Relative: 29 %
Lymphs Abs: 1.8 10*3/uL (ref 0.7–4.0)
MCH: 29.7 pg (ref 26.0–34.0)
MCHC: 34.5 g/dL (ref 30.0–36.0)
MCV: 85.9 fL (ref 80.0–100.0)
Monocytes Absolute: 0.3 10*3/uL (ref 0.1–1.0)
Monocytes Relative: 6 %
Neutro Abs: 3.7 10*3/uL (ref 1.7–7.7)
Neutrophils Relative %: 58 %
Platelets: 166 10*3/uL (ref 150–400)
RBC: 4.55 MIL/uL (ref 3.87–5.11)
RDW: 13.4 % (ref 11.5–15.5)
WBC: 6.2 10*3/uL (ref 4.0–10.5)
nRBC: 0 % (ref 0.0–0.2)

## 2024-07-11 LAB — LIPASE, BLOOD: Lipase: 43 U/L (ref 11–51)

## 2024-07-11 MED ORDER — ONDANSETRON HCL 4 MG/2ML IJ SOLN
4.0000 mg | Freq: Once | INTRAMUSCULAR | Status: AC
  Administered 2024-07-11: 4 mg via INTRAVENOUS
  Filled 2024-07-11: qty 2

## 2024-07-11 MED ORDER — KETOROLAC TROMETHAMINE 10 MG PO TABS
10.0000 mg | ORAL_TABLET | Freq: Four times a day (QID) | ORAL | 0 refills | Status: AC | PRN
  Filled 2024-07-11: qty 20, 5d supply, fill #0

## 2024-07-11 MED ORDER — SODIUM CHLORIDE 0.9 % IV BOLUS
1000.0000 mL | Freq: Once | INTRAVENOUS | Status: AC
  Administered 2024-07-11: 1000 mL via INTRAVENOUS

## 2024-07-11 MED ORDER — ACETAMINOPHEN 500 MG PO TABS: 1000.0000 mg | ORAL_TABLET | Freq: Once | ORAL | Status: DC

## 2024-07-11 MED ORDER — CYCLOBENZAPRINE HCL 10 MG PO TABS
10.0000 mg | ORAL_TABLET | Freq: Two times a day (BID) | ORAL | 0 refills | Status: AC | PRN
  Filled 2024-07-11: qty 20, 10d supply, fill #0

## 2024-07-11 MED ORDER — KETOROLAC TROMETHAMINE 15 MG/ML IJ SOLN
15.0000 mg | Freq: Once | INTRAMUSCULAR | Status: AC
  Administered 2024-07-11: 15 mg via INTRAVENOUS
  Filled 2024-07-11: qty 1

## 2024-07-11 MED ORDER — ACETAMINOPHEN 500 MG PO TABS
1000.0000 mg | ORAL_TABLET | Freq: Once | ORAL | Status: AC
  Administered 2024-07-11: 1000 mg via ORAL
  Filled 2024-07-11: qty 2

## 2024-07-11 NOTE — ED Triage Notes (Signed)
 Patient reports she has had nausea, body aches and chills a few days ago. Was taking tylenol  cold and flu with some symptom releif. She then started having bad back lower back pain with bilateral intermittent leg numbness and felt like she was going to fall because they were going to give out. Denies any urinary symptoms or incontinence. She says the pain in the legs is constant.

## 2024-07-11 NOTE — ED Notes (Signed)
 Patient assisted to restroom greater than voided, clear amber in color, not  grossly malodorus. Unable to obtain urine sample (dumped in toilet by mistake). Provider notified

## 2024-07-11 NOTE — ED Provider Notes (Addendum)
 " [place] EMERGENCY DEPARTMENT AT [facility] Provider Note   CSN: [id number] Arrival date & time: 07/11/24  1237     Patient presents with: Nausea and Back Pain  HPI Sandra Mathis is a 49 y.o. female with rheumatoid arthritis, lupus, asthma presenting for back pain. Started last night. Pain is in the middle of low back and radiates around the legs and down the hips. Also endorsing intermittent numbness in both legs. Denies trauma, abdominal pain, GI or urinary symptoms. Patient states that a few days ago she had nausea, body aches and chills. Has been taking tylenol . Denies fever.   Past Medical History:  Diagnosis Date   Arthritis    Asthma    Low blood pressure    Lupus    POTS (postural orthostatic tachycardia syndrome)    Rheumatoid arthritis (HCC)        Back Pain      Prior to Admission medications  Medication Sig Start Date End Date Taking? Authorizing Provider  cyclobenzaprine  (FLEXERIL ) 10 MG tablet Take 1 tablet (10 mg total) by mouth 2 (two) times daily as needed for muscle spasms. 07/11/24  Yes Garfield Coiner K, PA-C  ketorolac  (TORADOL ) 10 MG tablet Take 1 tablet (10 mg total) by mouth every 6 (six) hours as needed. 07/11/24  Yes Delma Drone K, PA-C  albuterol  (PROVENTIL ) (2.5 MG/3ML) 0.083% nebulizer solution Take 2.5 mg by nebulization every 6 (six) hours as needed. 06/13/19   [provider]  albuterol  (VENTOLIN  HFA) 108 (90 Base) MCG/ACT inhaler Inhale 2 puffs into the lungs every 6 (six) hours as needed for wheezing or shortness of breath.    [provider]  aspirin 81 MG chewable tablet Chew 81 mg by mouth daily.    [provider]  cetirizine (ZYRTEC) 10 MG tablet Take 10 mg by mouth daily.    [provider]  diphenhydrAMINE  (BENADRYL ) 25 mg capsule Take 25 mg by mouth every 6 (six) hours as needed.    [provider]  escitalopram  (LEXAPRO ) 10 MG tablet Take 1 tablet (10 mg total) by mouth daily.  04/06/23     famotidine (PEPCID) 20 MG tablet Take 20 mg by mouth 2 (two) times daily. 07/28/21   [provider]  gabapentin  (NEURONTIN ) 100 MG capsule Take 1 capsule (100 mg total) by mouth 3 (three) times daily. 03/27/24     midodrine  (PROAMATINE ) 2.5 MG tablet Take 1 tablet (2.5 mg total) by mouth daily. 07/27/23     predniSONE  (STERAPRED UNI-PAK 21 TAB) 5 MG (21) TBPK tablet Take as directed on package for 6 days. 12/20/23     RABEprazole  (ACIPHEX ) 20 MG tablet Take 1 tablet (20 mg total) by mouth 2 (two) times daily. 12/25/23     SUMAtriptan  (IMITREX ) 100 MG tablet Take 1 tablet (100 mg total) by mouth once as needed for up to 1 dose for migraine. May repeat in 2 hours if headache persists or recurs. 04/12/23   Leigh Venetia CROME, MD  topiramate  (TOPAMAX ) 25 MG tablet Take 3 tablets (75 mg total) by mouth daily. 11/29/23   Leigh Venetia CROME, MD  traZODone (DESYREL) 50 MG tablet Take 50 mg by mouth at bedtime as needed for sleep. 04/03/20   [provider]    Allergies: Iodine, Latex, Lithium, Morphine and codeine, Oxycontin [oxycodone], Penicillins, Percocet [oxycodone-acetaminophen ], Pineapple flavoring agent (non-screening), Shellfish allergy, Sulfa antibiotics, Tramadol, Adhesive [tape], Celebrex [celecoxib], Codeine, Doxycycline, Doxycycline, Iodine, Lithium, Lyrica [pregabalin], Morphine and codeine, Penicillins, Percocet [oxycodone-acetaminophen ],  Singulair [montelukast sodium], Singulair [montelukast], Sulfa antibiotics, and Adhesive [tape]    Review of Systems  Musculoskeletal:  Positive for back pain.    Updated Vital Signs BP 115/64   Pulse (!) 45   Temp 97.9 F (36.6 C) (Oral)   Resp 18   LMP 04/08/2016   SpO2 99%   Physical Exam Vitals and nursing note reviewed.  HENT:     Head: Normocephalic and atraumatic.     Mouth/Throat:     Mouth: Mucous membranes are moist.  Eyes:     General:        Right eye: No discharge.        Left eye: No discharge.      Conjunctiva/sclera: Conjunctivae normal.  Cardiovascular:     Rate and Rhythm: Normal rate and regular rhythm.     Pulses: Normal pulses.     Heart sounds: Normal heart sounds.  Pulmonary:     Effort: Pulmonary effort is normal.     Breath sounds: Normal breath sounds.  Abdominal:     General: Abdomen is flat.     Palpations: Abdomen is soft.  Skin:    General: Skin is warm and dry.  Neurological:     General: No focal deficit present.     Deep Tendon Reflexes: Babinski sign absent on the right side. Babinski sign absent on the left side.     Reflex Scores:      Patellar reflexes are 1+ on the right side and 1+ on the left side.      Achilles reflexes are 1+ on the right side and 1+ on the left side.    Comments: GCS 15. Speech is goal oriented. No deficits appreciated to CN III-XII; symmetric eyebrow raise, no facial drooping, tongue midline. Patient has equal grip strength bilaterally with 5/5 strength against resistance in upper extremities. Slight symmetric weakness in both lower legs but seems to effort based d/t pain. Sensation to light touch intact. Patient moves extremities without ataxia. Normal finger-nose-finger. Patient ambulatory with steady gait.  Psychiatric:        Mood and Affect: Mood normal.     (all labs ordered are listed, but only abnormal results are displayed) Labs Reviewed  COMPREHENSIVE METABOLIC PANEL WITH GFR - Abnormal; Notable for the following components:      Result Value   CO2 21 (*)    All other components within normal limits  CBC WITH DIFFERENTIAL/PLATELET  LIPASE, BLOOD    EKG: None  Radiology: No results found.   Procedures   Medications Ordered in the ED  acetaminophen  (TYLENOL ) tablet 1,000 mg (1,000 mg Oral Given 07/11/24 1540)  sodium chloride  0.9 % bolus 1,000 mL (0 mLs Intravenous Stopped 07/11/24 1700)  ondansetron  (ZOFRAN ) injection 4 mg (4 mg Intravenous Given 07/11/24 1554)  sodium chloride  0.9 % bolus 1,000 mL (0 mLs  Intravenous Stopped 07/11/24 1840)  ketorolac  (TORADOL ) 15 MG/ML injection 15 mg (15 mg Intravenous Given 07/11/24 1755)    Clinical Course as of 07/11/24 1852  Thu Jul 11, 2024  1703 RDW: 13.4 [JR]    Clinical Course User Index [JR] Lang Norleen POUR, PA-C                                 Medical Decision Making Amount and/or Complexity of Data Reviewed Labs: ordered. Decision-making details documented in ED Course.  Risk OTC drugs. Prescription drug management.   Initial Impression  and Ddx 49 yo well appearing female presenting for back pain. Exam mostly reassuring, possibly slight symmetric weakness in lower extremities but seem to be effort based secondary to her pain.  DDx includes cauda equina syndrome, osteomyelitis, other spinal infection, Guillain-Barr, UTI, pyelonephritis, appendicitis, ovarian torsion, other. Patient PMH that increases complexity of ED encounter:  rheumatoid arthritis, lupus, asthma  Interpretation of Diagnostics -I independent reviewed and interpreted the labs as followed: Unremarkable  Patient Reassessment and Ultimate Disposition/Management On reassessment pain had improved and she was noted to be sitting up in bed with legs crossed watching television and appeared comfortable.  Patient was able to void urine without issue.  Unfortunately sample was discarded.  I have a low suspicion for GU cause given that she has no CVA or abdominal pain and no urinary symptoms.  Discussed patient with Dr. Doretha.  We did consider Guillain-Barr but given that her reflexes are normal and weakness seems to be effort based and she has been ambulatory here multiple times to and from the bathroom.  Feel ultimately she is safe for discharge.  We did discuss return precautions.  I discussed if they persisted that she may need to return for further evaluation.  Otherwise advised to follow-up PCP.  Advised Tylenol  and ibuprofen for pain at home.  Discharged good  condition.  Patient management required discussion with the following services or consulting groups:  None  Complexity of Problems Addressed Acute complicated illness or Injury  Additional Data Reviewed and Analyzed Further history obtained from: Past medical history and medications listed in the EMR and Prior ED visit notes  Patient Encounter Risk Assessment Consideration of hospitalization      Final diagnoses:  Back pain, unspecified back location, unspecified back pain laterality, unspecified chronicity    ED Discharge Orders          Ordered    ketorolac  (TORADOL ) 10 MG tablet  Every 6 hours PRN        07/11/24 1852    cyclobenzaprine  (FLEXERIL ) 10 MG tablet  2 times daily PRN        07/11/24 1852               Lang Norleen POUR, PA-C 07/11/24 CLAIR    Doretha Folks, MD 07/11/24 1907  "

## 2024-07-11 NOTE — ED Notes (Signed)
Patient states she does not need to urinate at this time.

## 2024-07-11 NOTE — Discharge Instructions (Addendum)
 Evaluation today was overall reassuring.  Suspect this could be chronic back pain.  Recommend Tylenol  and ibuprofen at home.  I did send muscle relaxers to your pharmacy.  Please follow-up with your PCP.  As we discussed if your symptoms persist or worsen please return for further evaluation.

## 2024-07-12 ENCOUNTER — Other Ambulatory Visit (HOSPITAL_COMMUNITY): Payer: Self-pay

## 2024-07-12 ENCOUNTER — Other Ambulatory Visit: Payer: Self-pay

## 2024-07-12 ENCOUNTER — Encounter (HOSPITAL_COMMUNITY): Payer: Self-pay | Admitting: *Deleted

## 2024-07-12 ENCOUNTER — Emergency Department (HOSPITAL_COMMUNITY): Payer: Self-pay

## 2024-07-12 ENCOUNTER — Emergency Department (HOSPITAL_COMMUNITY): Admission: EM | Admit: 2024-07-12 | Payer: Self-pay | Source: Home / Self Care

## 2024-07-12 LAB — CBC WITH DIFFERENTIAL/PLATELET
Abs Immature Granulocytes: 0.02 10*3/uL (ref 0.00–0.07)
Basophils Absolute: 0 10*3/uL (ref 0.0–0.1)
Basophils Relative: 1 %
Eosinophils Absolute: 0.3 10*3/uL (ref 0.0–0.5)
Eosinophils Relative: 6 %
HCT: 38.2 % (ref 36.0–46.0)
Hemoglobin: 13.3 g/dL (ref 12.0–15.0)
Immature Granulocytes: 0 %
Lymphocytes Relative: 44 %
Lymphs Abs: 2.3 10*3/uL (ref 0.7–4.0)
MCH: 30.2 pg (ref 26.0–34.0)
MCHC: 34.8 g/dL (ref 30.0–36.0)
MCV: 86.6 fL (ref 80.0–100.0)
Monocytes Absolute: 0.4 10*3/uL (ref 0.1–1.0)
Monocytes Relative: 7 %
Neutro Abs: 2.2 10*3/uL (ref 1.7–7.7)
Neutrophils Relative %: 42 %
Platelets: 173 10*3/uL (ref 150–400)
RBC: 4.41 MIL/uL (ref 3.87–5.11)
RDW: 13.4 % (ref 11.5–15.5)
WBC: 5.2 10*3/uL (ref 4.0–10.5)
nRBC: 0 % (ref 0.0–0.2)

## 2024-07-12 LAB — BASIC METABOLIC PANEL WITH GFR
Anion gap: 12 (ref 5–15)
BUN: 15 mg/dL (ref 6–20)
CO2: 20 mmol/L — ABNORMAL LOW (ref 22–32)
Calcium: 8.8 mg/dL — ABNORMAL LOW (ref 8.9–10.3)
Chloride: 108 mmol/L (ref 98–111)
Creatinine, Ser: 0.99 mg/dL (ref 0.44–1.00)
GFR, Estimated: >60 mL/min
Glucose, Bld: 92 mg/dL (ref 70–99)
Potassium: 3.4 mmol/L — ABNORMAL LOW (ref 3.5–5.1)
Sodium: 139 mmol/L (ref 135–145)

## 2024-07-12 NOTE — ED Provider Triage Note (Signed)
 Emergency Medicine Provider Triage Evaluation Note  Sandra Mathis , a 49 y.o. female  was evaluated in triage.  Pt complains of back pain.  Patient reports being seen yesterday at [facility].  She reports that since then she has had an increase in her pain.  She was sent home with Toradol  and reports that this is longer helping.  Patient reports that she is also having some paresthesias in her legs.  She is able to walk however feels very weak.  She also reports some feeling like she cannot tell when she needs to urinate or have a bowel movement.  Review of Systems  Positive: Back pain Negative:   Physical Exam  BP (!) 124/59 (BP Location: Right Arm)   Pulse 78   Temp 98.5 F (36.9 C) (Oral)   Resp 18   Ht 5' 5 (1.651 m)   Wt 88 kg   LMP 04/08/2016   SpO2 98%   BMI 32.28 kg/m  Gen:   Awake, no distress   Resp:  Normal effort  MSK:   Moves extremities with pain Other:  Tenderness to palpation of the lower back.  Medical Decision Making  Medically screening exam initiated at 8:08 PM.  Appropriate orders placed.  Sandra Mathis was informed that the remainder of the evaluation will be completed by another provider, this initial triage assessment does not replace that evaluation, and the importance of remaining in the ED until their evaluation is complete.     Sandra Mathis, [place] 07/12/24 2011

## 2024-07-12 NOTE — ED Triage Notes (Signed)
 Pt in with tailbone pain, stretches down to bilateral legs; began 2 days ago - was seen at [facility] ED yesterday. Pt reports she is having bilateral numbness/weakness from legs down, has no urge to urinate or void. Pt feels these symptoms have worsened since yesterday. Denies any known injury or trauma
